# Patient Record
Sex: Female | Born: 1940
Health system: Southern US, Community
[De-identification: ages and names within clinical notes are randomized; demographics above are authoritative.]

## PROBLEM LIST (undated history)

## (undated) DIAGNOSIS — E785 Hyperlipidemia, unspecified: Secondary | ICD-10-CM

## (undated) DIAGNOSIS — G2581 Restless legs syndrome: Secondary | ICD-10-CM

## (undated) DIAGNOSIS — I10 Essential (primary) hypertension: Secondary | ICD-10-CM

## (undated) DIAGNOSIS — N390 Urinary tract infection, site not specified: Secondary | ICD-10-CM

## (undated) HISTORY — DX: Restless legs syndrome: G25.81

## (undated) HISTORY — DX: Essential (primary) hypertension: I10

## (undated) HISTORY — DX: Hyperlipidemia, unspecified: E78.5

## (undated) HISTORY — DX: Urinary tract infection, site not specified: N39.0

---
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
## 1995-07-20 HISTORY — PX: ABDOMINOPLASTY: SUR9

## 2014-08-14 DIAGNOSIS — N183 Chronic kidney disease, stage 3 unspecified: Secondary | ICD-10-CM | POA: Insufficient documentation

## 2014-08-14 DIAGNOSIS — I10 Essential (primary) hypertension: Secondary | ICD-10-CM

## 2014-08-14 DIAGNOSIS — E785 Hyperlipidemia, unspecified: Secondary | ICD-10-CM | POA: Insufficient documentation

## 2014-08-14 DIAGNOSIS — G2581 Restless legs syndrome: Secondary | ICD-10-CM

## 2014-08-14 DIAGNOSIS — I129 Hypertensive chronic kidney disease with stage 1 through stage 4 chronic kidney disease, or unspecified chronic kidney disease: Secondary | ICD-10-CM | POA: Insufficient documentation

## 2014-08-14 HISTORY — DX: Hyperlipidemia, unspecified: E78.5

## 2014-08-14 HISTORY — DX: Restless legs syndrome: G25.81

## 2014-08-14 HISTORY — DX: Essential (primary) hypertension: I10

## 2015-07-03 ENCOUNTER — Encounter: Payer: Self-pay | Admitting: Urology

## 2015-07-03 ENCOUNTER — Ambulatory Visit (INDEPENDENT_AMBULATORY_CARE_PROVIDER_SITE_OTHER): Payer: Medicare Other | Admitting: Urology

## 2015-07-03 VITALS — BP 157/78 | HR 73 | Ht 59.0 in | Wt 120.7 lb

## 2015-07-03 DIAGNOSIS — N393 Stress incontinence (female) (male): Secondary | ICD-10-CM | POA: Diagnosis not present

## 2015-07-03 DIAGNOSIS — Z87440 Personal history of urinary (tract) infections: Secondary | ICD-10-CM

## 2015-07-03 DIAGNOSIS — R351 Nocturia: Secondary | ICD-10-CM | POA: Diagnosis not present

## 2015-07-03 LAB — URINALYSIS, COMPLETE
BILIRUBIN UA: NEGATIVE
Glucose, UA: NEGATIVE
Ketones, UA: NEGATIVE
LEUKOCYTES UA: NEGATIVE
Nitrite, UA: NEGATIVE
PH UA: 7 (ref 5.0–7.5)
Protein, UA: NEGATIVE
Specific Gravity, UA: 1.01 (ref 1.005–1.030)
Urobilinogen, Ur: 0.2 mg/dL (ref 0.2–1.0)

## 2015-07-03 LAB — MICROSCOPIC EXAMINATION
BACTERIA UA: NONE SEEN
EPITHELIAL CELLS (NON RENAL): NONE SEEN /HPF (ref 0–10)
WBC UA: NONE SEEN /HPF (ref 0–?)

## 2015-07-03 NOTE — Progress Notes (Signed)
07/03/2015 4:00 PM   Kathryn Love 1940/12/24 [id number]  Referring provider/ PCP: Frazier Richards  Chief Complaint  Patient presents with  . Establish Care    New Patient    HPI: 74 yo F with who moved from [facility] who has moved here to live at [facility] with her husband.  She previously followed with a urologist there annually and would like to establish care here [facility].  She has a history of recurrent UTIs.  She is using Nitrofurantoin 50 mg self initiated when she has foul smelling urine.  She generally uses this medication for 4-5 days until her symptoms resolve. She is only needed to do this once over the past year. She's had no documented urine culture proven UTIs in the past year.  No personal history of kidney stones. No history of pyelonephritis or admisions for UITs.    She does drink plenty of water.     She does get up appromatley once nightly to void.   She doe have some mild SUI, wears 1 ppd which is not  No urgency, frequency, or urge incontience.     PMH: Past Medical History  Diagnosis Date  . Essential (primary) hypertension 08/14/2014    Last Assessment & Plan:  Blood pressure has been controlled without significant dizzyness or associated fatigue. Taking antihypertensives as directed without difficulty.    Marland Kitchen HLD (hyperlipidemia) 08/14/2014    Last Assessment & Plan:  Has been working on a low fat diet and no myalgia's are present.    Marland Kitchen Restless leg 08/14/2014    Last Assessment & Plan:  Gabapentin and Lorrin Mais are controlling symptoms reasonably well.    . Recurrent UTI     Surgical History: Past Surgical History  Procedure Laterality Date  . Tonsillectomy  1947  . Abdominoplasty  1997    Home Medications:    Medication List       This list is accurate as of: 07/03/15  4:00 PM.  Always use your most recent med list.               amLODipine 2.5 MG tablet  Commonly known as:  NORVASC     gabapentin 100 MG  capsule  Commonly known as:  NEURONTIN  Take by mouth.     lisinopril 40 MG tablet  Commonly known as:  PRINIVIL,ZESTRIL     multivitamin capsule  Take 1 capsule by mouth daily.     nitrofurantoin 50 MG capsule  Commonly known as:  MACRODANTIN  Take by mouth.     vitamin B-12 100 MCG tablet  Commonly known as:  CYANOCOBALAMIN  Take 100 mcg by mouth daily.     zolpidem 10 MG tablet  Commonly known as:  AMBIEN  Take by mouth.        Allergies: No Known Allergies  Family History: Family History  Problem Relation Age of Onset  . Bladder Cancer Mother   . Prostate cancer Neg Hx   . Kidney cancer Neg Hx     Social History:  reports that she has quit smoking. She does not have any smokeless tobacco history on file. She reports that she drinks alcohol. She reports that she does not use illicit drugs.  ROS: UROLOGY Frequent Urination?: No Hard to postpone urination?: No Burning/pain with urination?: No Get up at night to urinate?: Yes Leakage of urine?: No Urine stream starts and stops?: No Trouble starting stream?: No Do you have to strain to urinate?:  No Blood in urine?: No Urinary tract infection?: No Sexually transmitted disease?: No Injury to kidneys or bladder?: No Painful intercourse?: No Weak stream?: No Currently pregnant?: No Vaginal bleeding?: No Last menstrual period?: postmen  Gastrointestinal Nausea?: No Vomiting?: No Indigestion/heartburn?: No Diarrhea?: No Constipation?: No  Constitutional Fever: No Night sweats?: No Weight loss?: No Fatigue?: No  Skin Skin rash/lesions?: No Itching?: Yes  Eyes Blurred vision?: No Double vision?: No  Ears/Nose/Throat Sore throat?: No Sinus problems?: No  Hematologic/Lymphatic Swollen glands?: No Easy bruising?: Yes  Cardiovascular Leg swelling?: No Chest pain?: No  Respiratory Cough?: No Shortness of breath?: No  Endocrine Excessive thirst?: No  Musculoskeletal Back pain?:  No Joint pain?: No  Neurological Headaches?: No Dizziness?: No  Psychologic Depression?: No Anxiety?: No  Physical Exam: BP 157/78 mmHg  Pulse 73  Ht 4\' 11"  (1.499 m)  Wt 120 lb 11.2 oz (54.749 kg)  BMI 24.37 kg/m2  Constitutional:  Alert and oriented, No acute distress.   Healthy appearing for age.   HEENT:  Bend AT, moist mucus membranes.  Trachea midline, no masses. Cardiovascular: No clubbing, cyanosis, or edema. Respiratory: Normal respiratory effort, no increased work of breathing. GI: Abdomen is soft, nontender, nondistended, no abdominal masses GU: No CVA tenderness.  Skin: No rashes, bruises or suspicious lesions. Lymph: No cervical or inguinal adenopathy. Neurologic: Grossly intact, no focal deficits, moving all 4 extremities. Psychiatric: Normal mood and affect.  Laboratory Data: Basic Metabolic Panel (BMP) (02/04/2015 8:34 AM) Basic Metabolic Panel (BMP) (02/04/2015 8:34 AM)  Component Value Ref Range  Glucose 97 70 - 110 mg/dL  Sodium 02/06/2015 911 - 799 mmol/L  Potassium 5.0 3.6 - 5.1 mmol/L  Chloride 101 97 - 109 mmol/L  Carbon Dioxide (CO2) 31.3 22.0 - 32.0 mmol/L  Calcium 9.6 8.7 - 10.3 mg/dL  Urea Nitrogen (BUN) 13 7 - 25 mg/dL  Creatinine 0.8 0.6 - 1.1 mg/dL  Glomerular Filtration Rate (eGFR), MDRD Estimate 70      Urinalysis Results for orders placed or performed in visit on 07/03/15  Microscopic Examination  Result Value Ref Range   WBC, UA None seen 0 -  5 /hpf   RBC, UA 0-2 0 -  2 /hpf   Epithelial Cells (non renal) None seen 0 - 10 /hpf   Bacteria, UA None seen None seen/Few  Urinalysis, Complete  Result Value Ref Range   Specific Gravity, UA 1.010 1.005 - 1.030   pH, UA 7.0 5.0 - 7.5   Color, UA Yellow Yellow   Appearance Ur Clear Clear   Leukocytes, UA Negative Negative   Protein, UA Negative Negative/Trace   Glucose, UA Negative Negative   Ketones, UA Negative Negative   RBC, UA 1+ (A) Negative   Bilirubin, UA Negative Negative    Urobilinogen, Ur 0.2 0.2 - 1.0 mg/dL   Nitrite, UA Negative Negative   Microscopic Examination See below:      Pertinent Imaging: n/a  Assessment & Plan:    1. History of recurrent UTIs Remote history of recurrent urinary tract infections. She seems to be doing very well with her current regimen of low-dose Macrobid around the time of symptoms. She rarely has to use this. Plan to continue this regimen as it seems to be working.  She was advised she is more than welcome to drop off a urine sample here should she experience any worrisome symptoms. We're happy to see her for same day visit as needed.  2. Nocturia Minimal, once nightly. No bother. -  Urinalysis, Complete  3. SUI Mild, no intervention needed   Return if symptoms worsen or fail to improve.  Hollice Espy, MD  [facility] [address], [place], [place] [address] [phone]

## 2015-08-12 DIAGNOSIS — E7800 Pure hypercholesterolemia, unspecified: Secondary | ICD-10-CM | POA: Diagnosis not present

## 2015-08-12 DIAGNOSIS — I10 Essential (primary) hypertension: Secondary | ICD-10-CM | POA: Diagnosis not present

## 2015-08-19 DIAGNOSIS — Z0000 Encounter for general adult medical examination without abnormal findings: Secondary | ICD-10-CM | POA: Diagnosis not present

## 2015-08-19 DIAGNOSIS — I10 Essential (primary) hypertension: Secondary | ICD-10-CM | POA: Diagnosis not present

## 2015-08-19 DIAGNOSIS — Z1231 Encounter for screening mammogram for malignant neoplasm of breast: Secondary | ICD-10-CM | POA: Diagnosis not present

## 2015-08-19 DIAGNOSIS — E7800 Pure hypercholesterolemia, unspecified: Secondary | ICD-10-CM | POA: Diagnosis not present

## 2015-08-19 DIAGNOSIS — M810 Age-related osteoporosis without current pathological fracture: Secondary | ICD-10-CM | POA: Insufficient documentation

## 2015-08-19 DIAGNOSIS — M8580 Other specified disorders of bone density and structure, unspecified site: Secondary | ICD-10-CM | POA: Diagnosis not present

## 2015-08-19 DIAGNOSIS — G2581 Restless legs syndrome: Secondary | ICD-10-CM | POA: Diagnosis not present

## 2015-08-26 ENCOUNTER — Inpatient Hospital Stay
Admission: RE | Admit: 2015-08-26 | Discharge: 2015-08-26 | Disposition: A | Discharge status: Home / Self Care | Payer: Self-pay | Source: Ambulatory Visit | Attending: *Deleted | Admitting: *Deleted

## 2015-08-26 ENCOUNTER — Other Ambulatory Visit: Payer: Self-pay | Admitting: Internal Medicine

## 2015-08-26 ENCOUNTER — Other Ambulatory Visit: Payer: Self-pay | Admitting: *Deleted

## 2015-08-26 DIAGNOSIS — Z1231 Encounter for screening mammogram for malignant neoplasm of breast: Secondary | ICD-10-CM

## 2015-08-26 DIAGNOSIS — Z9289 Personal history of other medical treatment: Secondary | ICD-10-CM

## 2015-08-29 ENCOUNTER — Ambulatory Visit
Admission: RE | Admit: 2015-08-29 | Discharge: 2015-08-29 | Disposition: A | Discharge status: Home / Self Care | Payer: PPO | Source: Ambulatory Visit | Attending: Internal Medicine | Admitting: Internal Medicine

## 2015-08-29 DIAGNOSIS — M8580 Other specified disorders of bone density and structure, unspecified site: Secondary | ICD-10-CM | POA: Diagnosis not present

## 2015-08-29 DIAGNOSIS — Z1231 Encounter for screening mammogram for malignant neoplasm of breast: Secondary | ICD-10-CM | POA: Insufficient documentation

## 2015-08-29 DIAGNOSIS — Z86010 Personal history of colonic polyps: Secondary | ICD-10-CM | POA: Diagnosis not present

## 2015-09-12 DIAGNOSIS — L821 Other seborrheic keratosis: Secondary | ICD-10-CM | POA: Diagnosis not present

## 2015-09-15 ENCOUNTER — Ambulatory Visit: Payer: PPO | Admitting: Certified Registered Nurse Anesthetist

## 2015-09-15 ENCOUNTER — Encounter: Payer: Self-pay | Admitting: *Deleted

## 2015-09-15 ENCOUNTER — Encounter
Admission: RE | Disposition: A | Discharge status: Home / Self Care | Payer: Self-pay | Source: Ambulatory Visit | Attending: Gastroenterology

## 2015-09-15 ENCOUNTER — Ambulatory Visit
Admission: RE | Admit: 2015-09-15 | Discharge: 2015-09-15 | Disposition: A | Discharge status: Home / Self Care | Payer: PPO | Source: Ambulatory Visit | Attending: Gastroenterology | Admitting: Gastroenterology

## 2015-09-15 DIAGNOSIS — K5730 Diverticulosis of large intestine without perforation or abscess without bleeding: Secondary | ICD-10-CM | POA: Diagnosis not present

## 2015-09-15 DIAGNOSIS — K640 First degree hemorrhoids: Secondary | ICD-10-CM | POA: Insufficient documentation

## 2015-09-15 DIAGNOSIS — K635 Polyp of colon: Secondary | ICD-10-CM | POA: Diagnosis not present

## 2015-09-15 DIAGNOSIS — G2581 Restless legs syndrome: Secondary | ICD-10-CM | POA: Diagnosis not present

## 2015-09-15 DIAGNOSIS — E785 Hyperlipidemia, unspecified: Secondary | ICD-10-CM | POA: Insufficient documentation

## 2015-09-15 DIAGNOSIS — Z79899 Other long term (current) drug therapy: Secondary | ICD-10-CM | POA: Diagnosis not present

## 2015-09-15 DIAGNOSIS — Z87891 Personal history of nicotine dependence: Secondary | ICD-10-CM | POA: Diagnosis not present

## 2015-09-15 DIAGNOSIS — K5790 Diverticulosis of intestine, part unspecified, without perforation or abscess without bleeding: Secondary | ICD-10-CM | POA: Diagnosis not present

## 2015-09-15 DIAGNOSIS — I10 Essential (primary) hypertension: Secondary | ICD-10-CM | POA: Diagnosis not present

## 2015-09-15 DIAGNOSIS — K648 Other hemorrhoids: Secondary | ICD-10-CM | POA: Diagnosis not present

## 2015-09-15 DIAGNOSIS — Z1211 Encounter for screening for malignant neoplasm of colon: Secondary | ICD-10-CM | POA: Insufficient documentation

## 2015-09-15 DIAGNOSIS — D120 Benign neoplasm of cecum: Secondary | ICD-10-CM | POA: Insufficient documentation

## 2015-09-15 DIAGNOSIS — Z86010 Personal history of colonic polyps: Secondary | ICD-10-CM | POA: Diagnosis not present

## 2015-09-15 HISTORY — PX: COLONOSCOPY WITH PROPOFOL: SHX5780

## 2015-09-15 SURGERY — COLONOSCOPY WITH PROPOFOL
Anesthesia: General

## 2015-09-15 MED ORDER — SODIUM CHLORIDE 0.9 % IV SOLN
INTRAVENOUS | Status: DC
Start: 2015-09-15 — End: 2015-09-15
  Administered 2015-09-15: 10:00:00 via INTRAVENOUS

## 2015-09-15 MED ORDER — PROPOFOL 500 MG/50ML IV EMUL
INTRAVENOUS | Status: DC | PRN
  Administered 2015-09-15: 100 ug/kg/min via INTRAVENOUS

## 2015-09-15 MED ORDER — PROPOFOL 10 MG/ML IV BOLUS
INTRAVENOUS | Status: DC | PRN
  Administered 2015-09-15: 20 mg via INTRAVENOUS
  Administered 2015-09-15: 30 mg via INTRAVENOUS

## 2015-09-15 MED ORDER — MIDAZOLAM HCL 2 MG/2ML IJ SOLN
INTRAMUSCULAR | Status: DC | PRN
  Administered 2015-09-15: 1 mg via INTRAVENOUS

## 2015-09-15 NOTE — Discharge Instructions (Signed)

## 2015-09-15 NOTE — Transfer of Care (Signed)
Immediate Anesthesia Transfer of Care Note  Patient: Kathryn Love  Procedure(s) Performed: Procedure(s): COLONOSCOPY WITH PROPOFOL (N/A)  Patient Location: PACU  Anesthesia Type:General  Level of Consciousness: sedated  Airway & Oxygen Therapy: Patient Spontanous Breathing and Patient connected to nasal cannula oxygen  Post-op Assessment: Report given to RN and Post -op Vital signs reviewed and stable  Post vital signs: Reviewed and stable  Last Vitals:  Filed Vitals:   09/15/15 0935 09/15/15 1034  BP: 153/74 86/52  Pulse: 67 60  Temp: 37.1 C 35.9 C  Resp: 15 19    Complications: No apparent anesthesia complications

## 2015-09-15 NOTE — Anesthesia Procedure Notes (Signed)
Date/Time: 09/15/2015 10:05 AM Performed by: Johnna Acosta Pre-anesthesia Checklist: Patient identified, Emergency Drugs available, Suction available, Patient being monitored and Timeout performed Patient Re-evaluated:Patient Re-evaluated prior to inductionOxygen Delivery Method: Nasal cannula

## 2015-09-15 NOTE — Op Note (Signed)
[facility] Patient Name: Kathryn Love Procedure Date: 09/15/2015 9:39 AM MRN: [id number] Account #: [id number] Date of Birth: Mar 05, 1941 Admit Type: Outpatient Age: 75 Room: [facility] ENDO ROOM 3 Gender: Female Note Status: Finalized Procedure:            Colonoscopy Indications:          Surveillance: Personal history of adenomatous polyps on                        last colonoscopy 3 years ago Patient Profile:      This is a 75 year old female. Providers:            Gerrit Heck. Rayann Heman, MD Referring MD:         Ocie Cornfield. Ouida Sills, MD (Referring MD) Medicines:            Propofol per Anesthesia Complications:        No immediate complications. Procedure:            Pre-Anesthesia Assessment:                       - Prior to the procedure, a History and Physical was                        performed, and patient medications, allergies and                        sensitivities were reviewed. The patient's tolerance of                        previous anesthesia was reviewed.                       After obtaining informed consent, the colonoscope was                        passed under direct vision. Throughout the procedure,                        the patient's blood pressure, pulse, and oxygen                        saturations were monitored continuously. The                        Colonoscope was introduced through the anus and                        advanced to the the cecum, identified by appendiceal                        orifice and ileocecal valve. The colonoscopy was                        performed without difficulty. The patient tolerated the                        procedure well. The quality of the bowel preparation                        was excellent. Findings:  The perianal and digital rectal examinations were normal.      A 5 mm polyp was found in the cecum. The polyp was sessile. The polyp       was removed with a cold snare. Resection  and retrieval were complete.      A 3 mm polyp was found in the ileocecal valve. The polyp was sessile.       The polyp was removed with a jumbo cold forceps. Resection and retrieval       were complete.      A tattoo was seen in the sigmoid colon. The tattoo site appeared normal.      A tattoo was seen in the proximal ascending colon. The tattoo site       appeared normal.      A few small and large-mouthed diverticula were found in the sigmoid       colon.      Internal hemorrhoids were found during retroflexion. The hemorrhoids       were Grade I (internal hemorrhoids that do not prolapse).      The exam was otherwise without abnormality. Impression:           - One 5 mm polyp in the cecum, removed with a cold                        snare. Resected and retrieved.                       - One 3 mm polyp at the ileocecal valve, removed with a                        jumbo cold forceps. Resected and retrieved.                       - A tattoo was seen in the sigmoid colon. The tattoo                        site appeared normal.                       - A tattoo was seen in the proximal ascending colon.                        The tattoo site appeared normal.                       - Diverticulosis in the sigmoid colon.                       - Internal hemorrhoids.                       - The examination was otherwise normal. Recommendation:       - Observe patient in GI recovery unit.                       - High fiber diet.                       - Continue present medications.                       - Await pathology  results.                       - Return to referring physician.                       - The findings and recommendations were discussed with                        the patient.                       - The findings and recommendations were discussed with                        the patient's family.                       - Consider repeat colonoscopy for surveillance based on                         pathology results, will need to weigh risks and                        benefits given advanced age.. Procedure Code(s):    --- Professional ---                       [phone], Colonoscopy, flexible; with removal of tumor(s),                        polyp(s), or other lesion(s) by snare technique                       45380, 59, Colonoscopy, flexible; with biopsy, single                        or multiple Diagnosis Code(s):    --- Professional ---                       Z86.010, Personal history of colonic polyps                       D12.0, Benign neoplasm of cecum                       K64.0, First degree hemorrhoids                       K57.30, Diverticulosis of large intestine without                        perforation or abscess without bleeding CPT copyright 2016 American Medical Association. All rights reserved. The codes documented in this report are preliminary and upon coder review may  be revised to meet current compliance requirements. Mellody Life, MD 09/15/2015 10:34:38 AM This report has been signed electronically. Number of Addenda: 0 Note Initiated On: 09/15/2015 9:39 AM Scope Withdrawal Time: 0 hours 13 minutes 36 seconds  Total Procedure Duration: 0 hours 22 minutes 43 seconds       [facility]

## 2015-09-15 NOTE — Anesthesia Postprocedure Evaluation (Signed)
Anesthesia Post Note  Patient: Kathryn Love  Procedure(s) Performed: Procedure(s) (LRB): COLONOSCOPY WITH PROPOFOL (N/A)  Patient location during evaluation: PACU Anesthesia Type: General Level of consciousness: awake and alert Pain management: pain level controlled Vital Signs Assessment: post-procedure vital signs reviewed and stable Respiratory status: spontaneous breathing, nonlabored ventilation, respiratory function stable and patient connected to nasal cannula oxygen Cardiovascular status: blood pressure returned to baseline and stable Postop Assessment: no signs of nausea or vomiting Anesthetic complications: no    Last Vitals:  Filed Vitals:   09/15/15 1050 09/15/15 1100  BP: 97/57 116/68  Pulse: 57 68  Temp:    Resp: 14 19    Last Pain: There were no vitals filed for this visit.               Molli Barrows

## 2015-09-15 NOTE — H&P (Signed)
  Primary Care Physician:  Kirk Ruths., MD  Pre-Procedure History & Physical: HPI:  Kathryn Love is a 75 y.o. female is here for an colonoscopy.   Past Medical History  Diagnosis Date  . Essential (primary) hypertension 08/14/2014    Last Assessment & Plan:  Blood pressure has been controlled without significant dizzyness or associated fatigue. Taking antihypertensives as directed without difficulty.    Marland Kitchen HLD (hyperlipidemia) 08/14/2014    Last Assessment & Plan:  Has been working on a low fat diet and no myalgia's are present.    Marland Kitchen Restless leg 08/14/2014    Last Assessment & Plan:  Gabapentin and Lorrin Mais are controlling symptoms reasonably well.    . Recurrent UTI     Past Surgical History  Procedure Laterality Date  . Tonsillectomy  1947  . Abdominoplasty  1997  . Breast cyst excision Right 1960    Prior to Admission medications   Medication Sig Start Date End Date Taking? Authorizing Provider  amLODipine (NORVASC) 2.5 MG tablet  06/20/15  Yes Historical Provider, MD  lisinopril (PRINIVIL,ZESTRIL) 40 MG tablet  06/20/15  Yes Historical Provider, MD  Multiple Vitamin (MULTIVITAMIN) capsule Take 1 capsule by mouth daily.   Yes Historical Provider, MD  vitamin B-12 (CYANOCOBALAMIN) 100 MCG tablet Take 100 mcg by mouth daily.   Yes Historical Provider, MD  gabapentin (NEURONTIN) 100 MG capsule Take by mouth.    Historical Provider, MD  nitrofurantoin (MACRODANTIN) 50 MG capsule Take by mouth.    Historical Provider, MD  zolpidem (AMBIEN) 10 MG tablet Take by mouth. 02/11/15   Historical Provider, MD    Allergies as of 09/10/2015  . (No Known Allergies)    Family History  Problem Relation Age of Onset  . Bladder Cancer Mother   . Prostate cancer Neg Hx   . Kidney cancer Neg Hx   . Breast cancer Maternal Grandmother 49    Unsure    Social History   Social History  . Marital Status: Married    Spouse Name: N/A  . Number of Children: N/A  . Years of Education: N/A    Occupational History  . Not on file.   Social History Main Topics  . Smoking status: Former Research scientist (life sciences)  . Smokeless tobacco: Not on file  . Alcohol Use: 0.0 oz/week    0 Standard drinks or equivalent per week  . Drug Use: No  . Sexual Activity: Not on file   Other Topics Concern  . Not on file   Social History Narrative     Physical Exam: BP 153/74 mmHg  Pulse 67  Temp(Src) 98.7 F (37.1 C) (Tympanic)  Resp 15  Ht 4\' 10"  (1.473 m)  Wt 53.071 kg (117 lb)  BMI 24.46 kg/m2  SpO2 98% General:   Alert,  pleasant and cooperative in NAD Head:  Normocephalic and atraumatic. Neck:  Supple; no masses or thyromegaly. Lungs:  Clear throughout to auscultation.    Heart:  Regular rate and rhythm. Abdomen:  Soft, nontender and nondistended. Normal bowel sounds, without guarding, and without rebound.   Neurologic:  Alert and  oriented x4;  grossly normal neurologically.  Impression/Plan: Kathryn Love is here for an colonoscopy to be performed for surveillance  Risks, benefits, limitations, and alternatives regarding  colonoscopy have been reviewed with the patient.  Questions have been answered.  All parties agreeable.   Josefine Class, MD  09/15/2015, 10:03 AM

## 2015-09-15 NOTE — Anesthesia Preprocedure Evaluation (Signed)
Anesthesia Evaluation  Patient identified by MRN, date of birth, ID band Patient awake    Reviewed: Allergy & Precautions, H&P , NPO status , Patient's Chart, lab work & pertinent test results, reviewed documented beta blocker date and time   Airway Mallampati: II   Neck ROM: full    Dental  (+) Poor Dentition   Pulmonary neg pulmonary ROS, former smoker,    Pulmonary exam normal        Cardiovascular hypertension, negative cardio ROS Normal cardiovascular exam     Neuro/Psych negative neurological ROS  negative psych ROS   GI/Hepatic negative GI ROS, Neg liver ROS,   Endo/Other  negative endocrine ROS  Renal/GU negative Renal ROS  negative genitourinary   Musculoskeletal   Abdominal   Peds  Hematology negative hematology ROS (+)   Anesthesia Other Findings Past Medical History:   Essential (primary) hypertension                08/14/2014      Comment:Last Assessment & Plan:  Blood pressure has               been controlled without significant dizzyness               or associated fatigue. Taking antihypertensives              as directed without difficulty.     HLD (hyperlipidemia)                            08/14/2014      Comment:Last Assessment & Plan:  Has been working on a               low fat diet and no myalgia's are present.     Restless leg                                    08/14/2014      Comment:Last Assessment & Plan:  Gabapentin and Lorrin Mais               are controlling symptoms reasonably well.     Recurrent UTI                                              Past Surgical History:   TONSILLECTOMY                                    1947         ABDOMINOPLASTY                                   1997         BREAST CYST EXCISION                            Right 1960       BMI    Body Mass Index   24.45 kg/m 2     Reproductive/Obstetrics  Anesthesia  Physical Anesthesia Plan  ASA: II  Anesthesia Plan: General   Post-op Pain Management:    Induction:   Airway Management Planned:   Additional Equipment:   Intra-op Plan:   Post-operative Plan:   Informed Consent: I have reviewed the patients History and Physical, chart, labs and discussed the procedure including the risks, benefits and alternatives for the proposed anesthesia with the patient or authorized representative who has indicated his/her understanding and acceptance.   Dental Advisory Given  Plan Discussed with: CRNA  Anesthesia Plan Comments:         Anesthesia Quick Evaluation

## 2015-09-16 LAB — SURGICAL PATHOLOGY

## 2015-09-17 ENCOUNTER — Encounter: Payer: Self-pay | Admitting: Gastroenterology

## 2015-11-18 DIAGNOSIS — D0462 Carcinoma in situ of skin of left upper limb, including shoulder: Secondary | ICD-10-CM | POA: Diagnosis not present

## 2015-11-18 DIAGNOSIS — D485 Neoplasm of uncertain behavior of skin: Secondary | ICD-10-CM | POA: Diagnosis not present

## 2015-12-12 DIAGNOSIS — D0462 Carcinoma in situ of skin of left upper limb, including shoulder: Secondary | ICD-10-CM | POA: Diagnosis not present

## 2016-02-20 DIAGNOSIS — H26499 Other secondary cataract, unspecified eye: Secondary | ICD-10-CM | POA: Diagnosis not present

## 2016-03-12 DIAGNOSIS — L821 Other seborrheic keratosis: Secondary | ICD-10-CM | POA: Diagnosis not present

## 2016-03-12 DIAGNOSIS — X32XXXA Exposure to sunlight, initial encounter: Secondary | ICD-10-CM | POA: Diagnosis not present

## 2016-03-12 DIAGNOSIS — L570 Actinic keratosis: Secondary | ICD-10-CM | POA: Diagnosis not present

## 2016-03-12 DIAGNOSIS — L538 Other specified erythematous conditions: Secondary | ICD-10-CM | POA: Diagnosis not present

## 2016-03-12 DIAGNOSIS — D1801 Hemangioma of skin and subcutaneous tissue: Secondary | ICD-10-CM | POA: Diagnosis not present

## 2016-03-12 DIAGNOSIS — L298 Other pruritus: Secondary | ICD-10-CM | POA: Diagnosis not present

## 2016-03-12 DIAGNOSIS — L820 Inflamed seborrheic keratosis: Secondary | ICD-10-CM | POA: Diagnosis not present

## 2016-04-02 ENCOUNTER — Encounter: Payer: Self-pay | Admitting: Urology

## 2016-04-02 ENCOUNTER — Ambulatory Visit (INDEPENDENT_AMBULATORY_CARE_PROVIDER_SITE_OTHER): Payer: PPO | Admitting: Urology

## 2016-04-02 VITALS — BP 138/74 | HR 79 | Ht 59.0 in | Wt 119.0 lb

## 2016-04-02 DIAGNOSIS — N390 Urinary tract infection, site not specified: Secondary | ICD-10-CM | POA: Diagnosis not present

## 2016-04-02 DIAGNOSIS — R8290 Unspecified abnormal findings in urine: Secondary | ICD-10-CM | POA: Diagnosis not present

## 2016-04-02 LAB — URINALYSIS, COMPLETE
BILIRUBIN UA: NEGATIVE
GLUCOSE, UA: NEGATIVE
Ketones, UA: NEGATIVE
LEUKOCYTES UA: NEGATIVE
Nitrite, UA: NEGATIVE
PH UA: 7 (ref 5.0–7.5)
PROTEIN UA: NEGATIVE
Specific Gravity, UA: 1.01 (ref 1.005–1.030)
UUROB: 0.2 mg/dL (ref 0.2–1.0)

## 2016-04-02 LAB — BLADDER SCAN AMB NON-IMAGING

## 2016-04-02 LAB — MICROSCOPIC EXAMINATION: Bacteria, UA: NONE SEEN

## 2016-04-02 NOTE — Progress Notes (Signed)
04/02/2016 1:12 PM   Kathryn Love 07-10-1941 [id number]  Referring provider: Kirk Ruths, MD [facility] [place], [place] [address]  Chief Complaint  Patient presents with  . Recurrent UTI    HPI: 75 yo F with "recurrent" UTIs who returns for routine follow up after experience urinary symptoms 3 weeks ago which has resolved.    She has been managed with macrobid 50 mg once daily x 5 days when she feels that she is getting an infection as recommended by a previous Urologist.    She developed symptoms of foul smelling urine 3 weeks ago and was concerned about a UTI.  No other urinary symptoms including no dysuria, hematuria, or frequency/ urgency.  She self initiated abx and her symptoms resolved.  No UA/ UCx performed.    She has no urinary complaints today.    She does drink plenty of water.   She reports that she has a history of microscopic hematuria and had work up by her previous urology about ~1.5 year ago which was presumably normal.  She will send her records.     PMH: Past Medical History:  Diagnosis Date  . Essential (primary) hypertension 08/14/2014   Last Assessment & Plan:  Blood pressure has been controlled without significant dizzyness or associated fatigue. Taking antihypertensives as directed without difficulty.    Marland Kitchen HLD (hyperlipidemia) 08/14/2014   Last Assessment & Plan:  Has been working on a low fat diet and no myalgia's are present.    . Recurrent UTI   . Restless leg 08/14/2014   Last Assessment & Plan:  Gabapentin and Lorrin Mais are controlling symptoms reasonably well.      Surgical History: Past Surgical History:  Procedure Laterality Date  . ABDOMINOPLASTY  1997  . BREAST CYST EXCISION Right 1960  . COLONOSCOPY WITH PROPOFOL N/A 09/15/2015   Procedure: COLONOSCOPY WITH PROPOFOL;  Surgeon: Josefine Class, MD;  Location: [facility] ENDOSCOPY;  Service: Endoscopy;  Laterality: N/A;  . TONSILLECTOMY  1947    Home  Medications:    Medication List       Accurate as of 04/02/16  1:12 PM. Always use your most recent med list.          amLODipine 2.5 MG tablet Commonly known as:  NORVASC   calcium carbonate 1250 (500 Ca) MG tablet Commonly known as:  OS-CAL - dosed in mg of elemental calcium Take by mouth.   gabapentin 100 MG capsule Commonly known as:  NEURONTIN Take by mouth.   lisinopril 40 MG tablet Commonly known as:  PRINIVIL,ZESTRIL   Magnesium 250 MG Tabs Take by mouth.   multivitamin capsule Take 1 capsule by mouth daily.   nitrofurantoin 50 MG capsule Commonly known as:  MACRODANTIN Take by mouth.   polyethylene glycol-electrolytes 420 g solution Commonly known as:  NuLYTELY/GoLYTELY Take by mouth.   vitamin B-12 100 MCG tablet Commonly known as:  CYANOCOBALAMIN Take 100 mcg by mouth daily.   zolpidem 10 MG tablet Commonly known as:  AMBIEN Take by mouth.       Allergies: No Known Allergies  Family History: Family History  Problem Relation Age of Onset  . Bladder Cancer Mother   . Breast cancer Maternal Grandmother 67    Unsure  . Prostate cancer Neg Hx   . Kidney cancer Neg Hx     Social History:  reports that she has quit smoking. She has never used smokeless tobacco. She reports that  she drinks alcohol. She reports that she does not use drugs.  ROS: UROLOGY Frequent Urination?: No Hard to postpone urination?: No Burning/pain with urination?: No Get up at night to urinate?: No Leakage of urine?: No Urine stream starts and stops?: No Trouble starting stream?: No Do you have to strain to urinate?: No Blood in urine?: No Urinary tract infection?: No Sexually transmitted disease?: No Injury to kidneys or bladder?: No Painful intercourse?: No Weak stream?: No Currently pregnant?: No Vaginal bleeding?: No Last menstrual period?: n  Gastrointestinal Nausea?: No Vomiting?: No Indigestion/heartburn?: No Diarrhea?: No Constipation?:  No  Constitutional Fever: No Night sweats?: No Weight loss?: No Fatigue?: No  Skin Skin rash/lesions?: No Itching?: No  Eyes Blurred vision?: No Double vision?: No  Ears/Nose/Throat Sore throat?: No Sinus problems?: No  Hematologic/Lymphatic Swollen glands?: No Easy bruising?: No  Cardiovascular Leg swelling?: No Chest pain?: No  Respiratory Cough?: No Shortness of breath?: No  Endocrine Excessive thirst?: No  Musculoskeletal Back pain?: No Joint pain?: No  Neurological Headaches?: No Dizziness?: No  Psychologic Depression?: No Anxiety?: No  Physical Exam: BP 138/74   Pulse 79   Ht 4\' 11"  (1.499 m)   Wt 119 lb (54 kg)   BMI 24.04 kg/m   Constitutional:  Alert and oriented, No acute distress. HEENT: [place] AT, moist mucus membranes.  Trachea midline, no masses. Cardiovascular: No clubbing, cyanosis, or edema. Respiratory: Normal respiratory effort, no increased work of breathing. Skin: No rashes, bruises or suspicious lesions. Neurologic: Grossly intact, no focal deficits, moving all 4 extremities. Psychiatric: Normal mood and affect.  Laboratory Data: Labs and care every were reviewed today, creatinine 0.9 on 08/12/2015  Urinalysis UA today with 3-10 RBC/ HFP, otherwise negative, see EPIC  Pertinent Imaging: n/a  Assessment & Plan:    1. Recurrent UTI -Continue to encourage patient to bring in a UA/urine culture when she feels that she is having an infection as it is unclear whether or not she truly has recurrent urinary tract infections -Seems to be doing well with current regimen of Macrobid prophylactically but will likely not continue this prescription future due to concern for antibacterial resistance -Discussed probiotics today  - Urinalysis, Complete   2. Bad odor of urine Reviewed again today that foul-smelling odor is not necessarily indicative of an infection Encouraged adequate hydration and hygiene  3. Miroscopic  hematuria Incidental microscopic hematuria today, patient reports a long history of this in its previously undergone workup with her former urologist We will continue to follow this, review records at next appointment  Return in about 6 months (around 09/30/2016).  Hollice Espy, MD  [facility] [address] [place], [place] [address] [phone]

## 2016-06-04 DIAGNOSIS — Z0000 Encounter for general adult medical examination without abnormal findings: Secondary | ICD-10-CM | POA: Diagnosis not present

## 2016-08-13 DIAGNOSIS — I10 Essential (primary) hypertension: Secondary | ICD-10-CM | POA: Diagnosis not present

## 2016-08-13 DIAGNOSIS — Z0000 Encounter for general adult medical examination without abnormal findings: Secondary | ICD-10-CM | POA: Diagnosis not present

## 2016-08-13 DIAGNOSIS — E7800 Pure hypercholesterolemia, unspecified: Secondary | ICD-10-CM | POA: Diagnosis not present

## 2016-08-19 DIAGNOSIS — R05 Cough: Secondary | ICD-10-CM | POA: Diagnosis not present

## 2016-08-19 DIAGNOSIS — M810 Age-related osteoporosis without current pathological fracture: Secondary | ICD-10-CM | POA: Diagnosis not present

## 2016-08-19 DIAGNOSIS — I10 Essential (primary) hypertension: Secondary | ICD-10-CM | POA: Diagnosis not present

## 2016-08-19 DIAGNOSIS — E7800 Pure hypercholesterolemia, unspecified: Secondary | ICD-10-CM | POA: Diagnosis not present

## 2016-08-19 DIAGNOSIS — Z9289 Personal history of other medical treatment: Secondary | ICD-10-CM | POA: Insufficient documentation

## 2016-08-19 DIAGNOSIS — Z0000 Encounter for general adult medical examination without abnormal findings: Secondary | ICD-10-CM | POA: Diagnosis not present

## 2016-08-19 DIAGNOSIS — R7611 Nonspecific reaction to tuberculin skin test without active tuberculosis: Secondary | ICD-10-CM | POA: Diagnosis not present

## 2016-08-27 DIAGNOSIS — H26499 Other secondary cataract, unspecified eye: Secondary | ICD-10-CM | POA: Diagnosis not present

## 2016-09-17 DIAGNOSIS — L538 Other specified erythematous conditions: Secondary | ICD-10-CM | POA: Diagnosis not present

## 2016-09-17 DIAGNOSIS — Z85828 Personal history of other malignant neoplasm of skin: Secondary | ICD-10-CM | POA: Diagnosis not present

## 2016-09-17 DIAGNOSIS — D1801 Hemangioma of skin and subcutaneous tissue: Secondary | ICD-10-CM | POA: Diagnosis not present

## 2016-09-17 DIAGNOSIS — B078 Other viral warts: Secondary | ICD-10-CM | POA: Diagnosis not present

## 2016-09-17 DIAGNOSIS — Z08 Encounter for follow-up examination after completed treatment for malignant neoplasm: Secondary | ICD-10-CM | POA: Diagnosis not present

## 2016-09-17 DIAGNOSIS — L821 Other seborrheic keratosis: Secondary | ICD-10-CM | POA: Diagnosis not present

## 2016-10-01 ENCOUNTER — Ambulatory Visit: Payer: PPO | Admitting: Urology

## 2016-10-14 DIAGNOSIS — J449 Chronic obstructive pulmonary disease, unspecified: Secondary | ICD-10-CM | POA: Diagnosis not present

## 2016-10-14 DIAGNOSIS — J310 Chronic rhinitis: Secondary | ICD-10-CM | POA: Diagnosis not present

## 2016-10-14 DIAGNOSIS — R0602 Shortness of breath: Secondary | ICD-10-CM | POA: Diagnosis not present

## 2017-02-11 DIAGNOSIS — I10 Essential (primary) hypertension: Secondary | ICD-10-CM | POA: Diagnosis not present

## 2017-02-11 DIAGNOSIS — E7800 Pure hypercholesterolemia, unspecified: Secondary | ICD-10-CM | POA: Diagnosis not present

## 2017-03-04 DIAGNOSIS — E7800 Pure hypercholesterolemia, unspecified: Secondary | ICD-10-CM | POA: Diagnosis not present

## 2017-03-04 DIAGNOSIS — G2581 Restless legs syndrome: Secondary | ICD-10-CM | POA: Diagnosis not present

## 2017-03-04 DIAGNOSIS — I10 Essential (primary) hypertension: Secondary | ICD-10-CM | POA: Diagnosis not present

## 2017-03-04 DIAGNOSIS — Z1231 Encounter for screening mammogram for malignant neoplasm of breast: Secondary | ICD-10-CM | POA: Diagnosis not present

## 2017-03-04 DIAGNOSIS — R7611 Nonspecific reaction to tuberculin skin test without active tuberculosis: Secondary | ICD-10-CM | POA: Diagnosis not present

## 2017-03-14 DIAGNOSIS — H40003 Preglaucoma, unspecified, bilateral: Secondary | ICD-10-CM | POA: Diagnosis not present

## 2017-03-25 DIAGNOSIS — Z08 Encounter for follow-up examination after completed treatment for malignant neoplasm: Secondary | ICD-10-CM | POA: Diagnosis not present

## 2017-03-25 DIAGNOSIS — Z85828 Personal history of other malignant neoplasm of skin: Secondary | ICD-10-CM | POA: Diagnosis not present

## 2017-03-25 DIAGNOSIS — L821 Other seborrheic keratosis: Secondary | ICD-10-CM | POA: Diagnosis not present

## 2017-09-02 DIAGNOSIS — I10 Essential (primary) hypertension: Secondary | ICD-10-CM | POA: Diagnosis not present

## 2017-09-02 DIAGNOSIS — R350 Frequency of micturition: Secondary | ICD-10-CM | POA: Diagnosis not present

## 2017-09-09 DIAGNOSIS — E7800 Pure hypercholesterolemia, unspecified: Secondary | ICD-10-CM | POA: Diagnosis not present

## 2017-09-09 DIAGNOSIS — Z1231 Encounter for screening mammogram for malignant neoplasm of breast: Secondary | ICD-10-CM | POA: Diagnosis not present

## 2017-09-09 DIAGNOSIS — I10 Essential (primary) hypertension: Secondary | ICD-10-CM | POA: Diagnosis not present

## 2017-09-09 DIAGNOSIS — G2581 Restless legs syndrome: Secondary | ICD-10-CM | POA: Diagnosis not present

## 2017-09-23 DIAGNOSIS — D2261 Melanocytic nevi of right upper limb, including shoulder: Secondary | ICD-10-CM | POA: Diagnosis not present

## 2017-09-23 DIAGNOSIS — D2272 Melanocytic nevi of left lower limb, including hip: Secondary | ICD-10-CM | POA: Diagnosis not present

## 2017-09-23 DIAGNOSIS — Z85828 Personal history of other malignant neoplasm of skin: Secondary | ICD-10-CM | POA: Diagnosis not present

## 2017-09-23 DIAGNOSIS — L570 Actinic keratosis: Secondary | ICD-10-CM | POA: Diagnosis not present

## 2017-09-23 DIAGNOSIS — X32XXXA Exposure to sunlight, initial encounter: Secondary | ICD-10-CM | POA: Diagnosis not present

## 2017-09-23 DIAGNOSIS — L821 Other seborrheic keratosis: Secondary | ICD-10-CM | POA: Diagnosis not present

## 2018-03-03 DIAGNOSIS — I10 Essential (primary) hypertension: Secondary | ICD-10-CM | POA: Diagnosis not present

## 2018-03-03 DIAGNOSIS — E7800 Pure hypercholesterolemia, unspecified: Secondary | ICD-10-CM | POA: Diagnosis not present

## 2018-03-10 DIAGNOSIS — Z0000 Encounter for general adult medical examination without abnormal findings: Secondary | ICD-10-CM | POA: Diagnosis not present

## 2018-03-10 DIAGNOSIS — R079 Chest pain, unspecified: Secondary | ICD-10-CM | POA: Insufficient documentation

## 2018-03-10 DIAGNOSIS — E7800 Pure hypercholesterolemia, unspecified: Secondary | ICD-10-CM | POA: Diagnosis not present

## 2018-03-10 DIAGNOSIS — I10 Essential (primary) hypertension: Secondary | ICD-10-CM | POA: Diagnosis not present

## 2018-03-23 ENCOUNTER — Other Ambulatory Visit: Payer: Self-pay | Admitting: Internal Medicine

## 2018-03-23 DIAGNOSIS — Z1231 Encounter for screening mammogram for malignant neoplasm of breast: Secondary | ICD-10-CM

## 2018-03-27 ENCOUNTER — Ambulatory Visit
Admission: RE | Admit: 2018-03-27 | Discharge: 2018-03-27 | Disposition: A | Discharge status: Home / Self Care | Payer: PPO | Source: Ambulatory Visit | Attending: Internal Medicine | Admitting: Internal Medicine

## 2018-03-27 DIAGNOSIS — Z1231 Encounter for screening mammogram for malignant neoplasm of breast: Secondary | ICD-10-CM | POA: Insufficient documentation

## 2018-03-29 ENCOUNTER — Other Ambulatory Visit: Payer: Self-pay | Admitting: Internal Medicine

## 2018-03-29 DIAGNOSIS — N6320 Unspecified lump in the left breast, unspecified quadrant: Secondary | ICD-10-CM

## 2018-03-29 DIAGNOSIS — R928 Other abnormal and inconclusive findings on diagnostic imaging of breast: Secondary | ICD-10-CM

## 2018-03-31 DIAGNOSIS — L570 Actinic keratosis: Secondary | ICD-10-CM | POA: Diagnosis not present

## 2018-03-31 DIAGNOSIS — Z85828 Personal history of other malignant neoplasm of skin: Secondary | ICD-10-CM | POA: Diagnosis not present

## 2018-03-31 DIAGNOSIS — D2271 Melanocytic nevi of right lower limb, including hip: Secondary | ICD-10-CM | POA: Diagnosis not present

## 2018-03-31 DIAGNOSIS — D2261 Melanocytic nevi of right upper limb, including shoulder: Secondary | ICD-10-CM | POA: Diagnosis not present

## 2018-03-31 DIAGNOSIS — D225 Melanocytic nevi of trunk: Secondary | ICD-10-CM | POA: Diagnosis not present

## 2018-03-31 DIAGNOSIS — Z08 Encounter for follow-up examination after completed treatment for malignant neoplasm: Secondary | ICD-10-CM | POA: Diagnosis not present

## 2018-03-31 DIAGNOSIS — D2272 Melanocytic nevi of left lower limb, including hip: Secondary | ICD-10-CM | POA: Diagnosis not present

## 2018-03-31 DIAGNOSIS — D2262 Melanocytic nevi of left upper limb, including shoulder: Secondary | ICD-10-CM | POA: Diagnosis not present

## 2018-03-31 DIAGNOSIS — X32XXXA Exposure to sunlight, initial encounter: Secondary | ICD-10-CM | POA: Diagnosis not present

## 2018-03-31 DIAGNOSIS — L821 Other seborrheic keratosis: Secondary | ICD-10-CM | POA: Diagnosis not present

## 2018-04-03 DIAGNOSIS — Z0000 Encounter for general adult medical examination without abnormal findings: Secondary | ICD-10-CM | POA: Diagnosis not present

## 2018-04-05 ENCOUNTER — Ambulatory Visit
Admission: RE | Admit: 2018-04-05 | Discharge: 2018-04-05 | Disposition: A | Discharge status: Home / Self Care | Payer: PPO | Source: Ambulatory Visit | Attending: Internal Medicine | Admitting: Internal Medicine

## 2018-04-05 DIAGNOSIS — R922 Inconclusive mammogram: Secondary | ICD-10-CM | POA: Diagnosis not present

## 2018-04-05 DIAGNOSIS — N6320 Unspecified lump in the left breast, unspecified quadrant: Secondary | ICD-10-CM

## 2018-04-05 DIAGNOSIS — R928 Other abnormal and inconclusive findings on diagnostic imaging of breast: Secondary | ICD-10-CM | POA: Diagnosis not present

## 2018-04-05 DIAGNOSIS — N6002 Solitary cyst of left breast: Secondary | ICD-10-CM | POA: Diagnosis not present

## 2018-05-18 DIAGNOSIS — H34811 Central retinal vein occlusion, right eye, with macular edema: Secondary | ICD-10-CM | POA: Diagnosis not present

## 2018-06-02 ENCOUNTER — Other Ambulatory Visit: Payer: Self-pay | Admitting: Pharmacist

## 2018-06-02 NOTE — Patient Outreach (Signed)
[facility]) Care Management  06/02/2018  Tracee Mccreery 1941-02-06 [id number]   Outreach call to Marice Potter regarding her request for follow up from the [facility] Medication Adherence [facility]. Left a HIPAA compliant message on the patient's voicemail.  Harlow Asa, PharmD, [place] [facility] [phone]

## 2018-07-25 DIAGNOSIS — H34811 Central retinal vein occlusion, right eye, with macular edema: Secondary | ICD-10-CM | POA: Diagnosis not present

## 2018-09-08 DIAGNOSIS — E7800 Pure hypercholesterolemia, unspecified: Secondary | ICD-10-CM | POA: Diagnosis not present

## 2018-09-08 DIAGNOSIS — I10 Essential (primary) hypertension: Secondary | ICD-10-CM | POA: Diagnosis not present

## 2018-09-15 DIAGNOSIS — I10 Essential (primary) hypertension: Secondary | ICD-10-CM | POA: Diagnosis not present

## 2018-09-15 DIAGNOSIS — M810 Age-related osteoporosis without current pathological fracture: Secondary | ICD-10-CM | POA: Diagnosis not present

## 2018-09-15 DIAGNOSIS — E7800 Pure hypercholesterolemia, unspecified: Secondary | ICD-10-CM | POA: Diagnosis not present

## 2018-09-15 DIAGNOSIS — R079 Chest pain, unspecified: Secondary | ICD-10-CM | POA: Diagnosis not present

## 2018-09-29 DIAGNOSIS — Z85828 Personal history of other malignant neoplasm of skin: Secondary | ICD-10-CM | POA: Diagnosis not present

## 2018-09-29 DIAGNOSIS — D2262 Melanocytic nevi of left upper limb, including shoulder: Secondary | ICD-10-CM | POA: Diagnosis not present

## 2018-09-29 DIAGNOSIS — D225 Melanocytic nevi of trunk: Secondary | ICD-10-CM | POA: Diagnosis not present

## 2018-09-29 DIAGNOSIS — L570 Actinic keratosis: Secondary | ICD-10-CM | POA: Diagnosis not present

## 2018-09-29 DIAGNOSIS — D2261 Melanocytic nevi of right upper limb, including shoulder: Secondary | ICD-10-CM | POA: Diagnosis not present

## 2018-09-29 DIAGNOSIS — D485 Neoplasm of uncertain behavior of skin: Secondary | ICD-10-CM | POA: Diagnosis not present

## 2018-09-29 DIAGNOSIS — L821 Other seborrheic keratosis: Secondary | ICD-10-CM | POA: Diagnosis not present

## 2018-09-29 DIAGNOSIS — D2272 Melanocytic nevi of left lower limb, including hip: Secondary | ICD-10-CM | POA: Diagnosis not present

## 2018-09-29 DIAGNOSIS — D2271 Melanocytic nevi of right lower limb, including hip: Secondary | ICD-10-CM | POA: Diagnosis not present

## 2018-09-29 DIAGNOSIS — Z08 Encounter for follow-up examination after completed treatment for malignant neoplasm: Secondary | ICD-10-CM | POA: Diagnosis not present

## 2019-01-15 DIAGNOSIS — H34811 Central retinal vein occlusion, right eye, with macular edema: Secondary | ICD-10-CM | POA: Diagnosis not present

## 2019-03-09 DIAGNOSIS — M1712 Unilateral primary osteoarthritis, left knee: Secondary | ICD-10-CM | POA: Diagnosis not present

## 2019-03-09 DIAGNOSIS — M25562 Pain in left knee: Secondary | ICD-10-CM | POA: Diagnosis not present

## 2019-03-27 DIAGNOSIS — E7800 Pure hypercholesterolemia, unspecified: Secondary | ICD-10-CM | POA: Diagnosis not present

## 2019-03-27 DIAGNOSIS — I10 Essential (primary) hypertension: Secondary | ICD-10-CM | POA: Diagnosis not present

## 2019-03-30 DIAGNOSIS — M810 Age-related osteoporosis without current pathological fracture: Secondary | ICD-10-CM | POA: Diagnosis not present

## 2019-03-30 DIAGNOSIS — Z1231 Encounter for screening mammogram for malignant neoplasm of breast: Secondary | ICD-10-CM | POA: Diagnosis not present

## 2019-03-30 DIAGNOSIS — E7800 Pure hypercholesterolemia, unspecified: Secondary | ICD-10-CM | POA: Diagnosis not present

## 2019-03-30 DIAGNOSIS — I10 Essential (primary) hypertension: Secondary | ICD-10-CM | POA: Diagnosis not present

## 2019-03-30 DIAGNOSIS — Z0000 Encounter for general adult medical examination without abnormal findings: Secondary | ICD-10-CM | POA: Diagnosis not present

## 2019-04-10 ENCOUNTER — Other Ambulatory Visit: Payer: Self-pay | Admitting: Internal Medicine

## 2019-04-10 DIAGNOSIS — Z1231 Encounter for screening mammogram for malignant neoplasm of breast: Secondary | ICD-10-CM

## 2019-04-30 DIAGNOSIS — L570 Actinic keratosis: Secondary | ICD-10-CM | POA: Diagnosis not present

## 2019-04-30 DIAGNOSIS — L820 Inflamed seborrheic keratosis: Secondary | ICD-10-CM | POA: Diagnosis not present

## 2019-04-30 DIAGNOSIS — L821 Other seborrheic keratosis: Secondary | ICD-10-CM | POA: Diagnosis not present

## 2019-04-30 DIAGNOSIS — X32XXXA Exposure to sunlight, initial encounter: Secondary | ICD-10-CM | POA: Diagnosis not present

## 2019-04-30 DIAGNOSIS — Z08 Encounter for follow-up examination after completed treatment for malignant neoplasm: Secondary | ICD-10-CM | POA: Diagnosis not present

## 2019-04-30 DIAGNOSIS — D485 Neoplasm of uncertain behavior of skin: Secondary | ICD-10-CM | POA: Diagnosis not present

## 2019-04-30 DIAGNOSIS — Z85828 Personal history of other malignant neoplasm of skin: Secondary | ICD-10-CM | POA: Diagnosis not present

## 2019-05-03 DIAGNOSIS — H40003 Preglaucoma, unspecified, bilateral: Secondary | ICD-10-CM | POA: Diagnosis not present

## 2019-05-28 ENCOUNTER — Ambulatory Visit
Admission: RE | Admit: 2019-05-28 | Discharge: 2019-05-28 | Disposition: A | Discharge status: Home / Self Care | Payer: PPO | Source: Ambulatory Visit | Attending: Internal Medicine | Admitting: Internal Medicine

## 2019-05-28 DIAGNOSIS — Z1231 Encounter for screening mammogram for malignant neoplasm of breast: Secondary | ICD-10-CM | POA: Insufficient documentation

## 2019-09-14 DIAGNOSIS — R8290 Unspecified abnormal findings in urine: Secondary | ICD-10-CM | POA: Diagnosis not present

## 2019-09-14 DIAGNOSIS — N3090 Cystitis, unspecified without hematuria: Secondary | ICD-10-CM | POA: Diagnosis not present

## 2019-09-14 DIAGNOSIS — E7800 Pure hypercholesterolemia, unspecified: Secondary | ICD-10-CM | POA: Diagnosis not present

## 2019-09-14 DIAGNOSIS — I10 Essential (primary) hypertension: Secondary | ICD-10-CM | POA: Diagnosis not present

## 2019-09-21 DIAGNOSIS — I10 Essential (primary) hypertension: Secondary | ICD-10-CM | POA: Diagnosis not present

## 2019-09-21 DIAGNOSIS — H6123 Impacted cerumen, bilateral: Secondary | ICD-10-CM | POA: Diagnosis not present

## 2019-09-21 DIAGNOSIS — E7800 Pure hypercholesterolemia, unspecified: Secondary | ICD-10-CM | POA: Diagnosis not present

## 2019-09-21 DIAGNOSIS — M810 Age-related osteoporosis without current pathological fracture: Secondary | ICD-10-CM | POA: Diagnosis not present

## 2019-09-28 DIAGNOSIS — Z85828 Personal history of other malignant neoplasm of skin: Secondary | ICD-10-CM | POA: Diagnosis not present

## 2019-09-28 DIAGNOSIS — D2271 Melanocytic nevi of right lower limb, including hip: Secondary | ICD-10-CM | POA: Diagnosis not present

## 2019-09-28 DIAGNOSIS — L821 Other seborrheic keratosis: Secondary | ICD-10-CM | POA: Diagnosis not present

## 2019-09-28 DIAGNOSIS — D2272 Melanocytic nevi of left lower limb, including hip: Secondary | ICD-10-CM | POA: Diagnosis not present

## 2019-09-28 DIAGNOSIS — D2261 Melanocytic nevi of right upper limb, including shoulder: Secondary | ICD-10-CM | POA: Diagnosis not present

## 2019-09-28 DIAGNOSIS — D2262 Melanocytic nevi of left upper limb, including shoulder: Secondary | ICD-10-CM | POA: Diagnosis not present

## 2019-11-02 DIAGNOSIS — H40003 Preglaucoma, unspecified, bilateral: Secondary | ICD-10-CM | POA: Diagnosis not present

## 2019-11-07 DIAGNOSIS — H40003 Preglaucoma, unspecified, bilateral: Secondary | ICD-10-CM | POA: Diagnosis not present

## 2020-01-11 DIAGNOSIS — L710 Perioral dermatitis: Secondary | ICD-10-CM | POA: Diagnosis not present

## 2020-01-11 DIAGNOSIS — I788 Other diseases of capillaries: Secondary | ICD-10-CM | POA: Diagnosis not present

## 2020-03-25 DIAGNOSIS — E7800 Pure hypercholesterolemia, unspecified: Secondary | ICD-10-CM | POA: Diagnosis not present

## 2020-03-25 DIAGNOSIS — I10 Essential (primary) hypertension: Secondary | ICD-10-CM | POA: Diagnosis not present

## 2020-03-31 DIAGNOSIS — I10 Essential (primary) hypertension: Secondary | ICD-10-CM | POA: Diagnosis not present

## 2020-03-31 DIAGNOSIS — Z0000 Encounter for general adult medical examination without abnormal findings: Secondary | ICD-10-CM | POA: Diagnosis not present

## 2020-03-31 DIAGNOSIS — E7800 Pure hypercholesterolemia, unspecified: Secondary | ICD-10-CM | POA: Diagnosis not present

## 2020-04-11 DIAGNOSIS — D2262 Melanocytic nevi of left upper limb, including shoulder: Secondary | ICD-10-CM | POA: Diagnosis not present

## 2020-04-11 DIAGNOSIS — D2271 Melanocytic nevi of right lower limb, including hip: Secondary | ICD-10-CM | POA: Diagnosis not present

## 2020-04-11 DIAGNOSIS — D225 Melanocytic nevi of trunk: Secondary | ICD-10-CM | POA: Diagnosis not present

## 2020-04-11 DIAGNOSIS — D485 Neoplasm of uncertain behavior of skin: Secondary | ICD-10-CM | POA: Diagnosis not present

## 2020-04-11 DIAGNOSIS — Z85828 Personal history of other malignant neoplasm of skin: Secondary | ICD-10-CM | POA: Diagnosis not present

## 2020-04-11 DIAGNOSIS — D2261 Melanocytic nevi of right upper limb, including shoulder: Secondary | ICD-10-CM | POA: Diagnosis not present

## 2020-04-11 DIAGNOSIS — D0462 Carcinoma in situ of skin of left upper limb, including shoulder: Secondary | ICD-10-CM | POA: Diagnosis not present

## 2020-04-11 DIAGNOSIS — L821 Other seborrheic keratosis: Secondary | ICD-10-CM | POA: Diagnosis not present

## 2020-04-21 DIAGNOSIS — C44729 Squamous cell carcinoma of skin of left lower limb, including hip: Secondary | ICD-10-CM | POA: Diagnosis not present

## 2020-04-21 DIAGNOSIS — C44629 Squamous cell carcinoma of skin of left upper limb, including shoulder: Secondary | ICD-10-CM | POA: Diagnosis not present

## 2020-05-21 DIAGNOSIS — H40003 Preglaucoma, unspecified, bilateral: Secondary | ICD-10-CM | POA: Diagnosis not present

## 2020-06-20 DIAGNOSIS — D485 Neoplasm of uncertain behavior of skin: Secondary | ICD-10-CM | POA: Diagnosis not present

## 2020-06-20 DIAGNOSIS — C44629 Squamous cell carcinoma of skin of left upper limb, including shoulder: Secondary | ICD-10-CM | POA: Diagnosis not present

## 2020-07-08 DIAGNOSIS — C44629 Squamous cell carcinoma of skin of left upper limb, including shoulder: Secondary | ICD-10-CM | POA: Diagnosis not present

## 2020-09-22 DIAGNOSIS — E7800 Pure hypercholesterolemia, unspecified: Secondary | ICD-10-CM | POA: Diagnosis not present

## 2020-09-22 DIAGNOSIS — Z0000 Encounter for general adult medical examination without abnormal findings: Secondary | ICD-10-CM | POA: Diagnosis not present

## 2020-09-22 DIAGNOSIS — I10 Essential (primary) hypertension: Secondary | ICD-10-CM | POA: Diagnosis not present

## 2020-09-29 DIAGNOSIS — E7800 Pure hypercholesterolemia, unspecified: Secondary | ICD-10-CM | POA: Diagnosis not present

## 2020-09-29 DIAGNOSIS — N183 Chronic kidney disease, stage 3 unspecified: Secondary | ICD-10-CM | POA: Diagnosis not present

## 2020-09-29 DIAGNOSIS — M810 Age-related osteoporosis without current pathological fracture: Secondary | ICD-10-CM | POA: Diagnosis not present

## 2020-09-29 DIAGNOSIS — G2581 Restless legs syndrome: Secondary | ICD-10-CM | POA: Diagnosis not present

## 2020-09-29 DIAGNOSIS — I129 Hypertensive chronic kidney disease with stage 1 through stage 4 chronic kidney disease, or unspecified chronic kidney disease: Secondary | ICD-10-CM | POA: Diagnosis not present

## 2020-10-01 DIAGNOSIS — M810 Age-related osteoporosis without current pathological fracture: Secondary | ICD-10-CM | POA: Diagnosis not present

## 2020-10-10 DIAGNOSIS — Z85828 Personal history of other malignant neoplasm of skin: Secondary | ICD-10-CM | POA: Diagnosis not present

## 2020-10-10 DIAGNOSIS — D485 Neoplasm of uncertain behavior of skin: Secondary | ICD-10-CM | POA: Diagnosis not present

## 2020-10-10 DIAGNOSIS — Z08 Encounter for follow-up examination after completed treatment for malignant neoplasm: Secondary | ICD-10-CM | POA: Diagnosis not present

## 2020-10-10 DIAGNOSIS — D0461 Carcinoma in situ of skin of right upper limb, including shoulder: Secondary | ICD-10-CM | POA: Diagnosis not present

## 2020-10-10 DIAGNOSIS — D2262 Melanocytic nevi of left upper limb, including shoulder: Secondary | ICD-10-CM | POA: Diagnosis not present

## 2020-10-10 DIAGNOSIS — L821 Other seborrheic keratosis: Secondary | ICD-10-CM | POA: Diagnosis not present

## 2020-10-10 DIAGNOSIS — D2272 Melanocytic nevi of left lower limb, including hip: Secondary | ICD-10-CM | POA: Diagnosis not present

## 2020-10-10 DIAGNOSIS — D2261 Melanocytic nevi of right upper limb, including shoulder: Secondary | ICD-10-CM | POA: Diagnosis not present

## 2020-10-20 ENCOUNTER — Other Ambulatory Visit: Payer: Self-pay

## 2020-10-20 ENCOUNTER — Emergency Department: Payer: PPO

## 2020-10-20 ENCOUNTER — Emergency Department
Admission: EM | Admit: 2020-10-20 | Discharge: 2020-10-20 | Disposition: A | Discharge status: Home / Self Care | Payer: PPO | Attending: Emergency Medicine | Admitting: Emergency Medicine

## 2020-10-20 DIAGNOSIS — Z79899 Other long term (current) drug therapy: Secondary | ICD-10-CM | POA: Insufficient documentation

## 2020-10-20 DIAGNOSIS — Z87891 Personal history of nicotine dependence: Secondary | ICD-10-CM | POA: Diagnosis not present

## 2020-10-20 DIAGNOSIS — I10 Essential (primary) hypertension: Secondary | ICD-10-CM | POA: Insufficient documentation

## 2020-10-20 DIAGNOSIS — R0789 Other chest pain: Secondary | ICD-10-CM | POA: Diagnosis not present

## 2020-10-20 DIAGNOSIS — D0461 Carcinoma in situ of skin of right upper limb, including shoulder: Secondary | ICD-10-CM | POA: Diagnosis not present

## 2020-10-20 DIAGNOSIS — R079 Chest pain, unspecified: Secondary | ICD-10-CM | POA: Diagnosis not present

## 2020-10-20 LAB — BASIC METABOLIC PANEL
Anion gap: 11 (ref 5–15)
BUN: 14 mg/dL (ref 8–23)
CO2: 22 mmol/L (ref 22–32)
Calcium: 9.7 mg/dL (ref 8.9–10.3)
Chloride: 104 mmol/L (ref 98–111)
Creatinine, Ser: 0.87 mg/dL (ref 0.44–1.00)
GFR, Estimated: >60 mL/min (ref >60)
Glucose, Bld: 120 mg/dL — ABNORMAL HIGH (ref 70–99)
Potassium: 4 mmol/L (ref 3.5–5.1)
Sodium: 137 mmol/L (ref 135–145)

## 2020-10-20 LAB — CBC
HCT: 42.4 % (ref 36.0–46.0)
Hemoglobin: 13.8 g/dL (ref 12.0–15.0)
MCH: 29.2 pg (ref 26.0–34.0)
MCHC: 32.5 g/dL (ref 30.0–36.0)
MCV: 89.8 fL (ref 80.0–100.0)
Platelets: 208 10*3/uL (ref 150–400)
RBC: 4.72 MIL/uL (ref 3.87–5.11)
RDW: 13.3 % (ref 11.5–15.5)
WBC: 6.1 10*3/uL (ref 4.0–10.5)
nRBC: 0 % (ref 0.0–0.2)

## 2020-10-20 LAB — TROPONIN I (HIGH SENSITIVITY): Troponin I (High Sensitivity): 6 ng/L (ref <18)

## 2020-10-20 NOTE — ED Provider Notes (Signed)
[facility] Emergency Department Provider Note   ____________________________________________    I have reviewed the triage vital signs and the nursing notes.   HISTORY  Chief Complaint Chest Pain     HPI Kathryn Love is a 80 y.o. female with history as detailed below who presents with complaints of chest tightness, now resolved.  Patient reports this is been intermittent over the last week, does not seem to be exertional in nature.  Denies shortness of breath.  No pleurisy.  No calf pain or swelling.  No nausea vomiting or diaphoresis.  No history of heart disease.  Does not smoke.  Has not take anything for this.  Last felt symptoms at 4 AM   Past Medical History:  Diagnosis Date  . Essential (primary) hypertension 08/14/2014   Last Assessment & Plan:  Blood pressure has been controlled without significant dizzyness or associated fatigue. Taking antihypertensives as directed without difficulty.    Marland Kitchen HLD (hyperlipidemia) 08/14/2014   Last Assessment & Plan:  Has been working on a low fat diet and no myalgia's are present.    . Recurrent UTI   . Restless leg 08/14/2014   Last Assessment & Plan:  Gabapentin and Lorrin Mais are controlling symptoms reasonably well.      Patient Active Problem List   Diagnosis Date Noted  . Essential (primary) hypertension 08/14/2014  . HLD (hyperlipidemia) 08/14/2014  . Restless leg 08/14/2014    Past Surgical History:  Procedure Laterality Date  . ABDOMINOPLASTY  1997  . BREAST CYST EXCISION Right 1960  . COLONOSCOPY WITH PROPOFOL N/A 09/15/2015   Procedure: COLONOSCOPY WITH PROPOFOL;  Surgeon: Josefine Class, MD;  Location: [facility] ENDOSCOPY;  Service: Endoscopy;  Laterality: N/A;  . TONSILLECTOMY  1947    Prior to Admission medications   Medication Sig Start Date End Date Taking? Authorizing Provider  amLODipine (NORVASC) 2.5 MG tablet  06/20/15   [provider]  calcium carbonate (OS-CAL - DOSED IN MG  OF ELEMENTAL CALCIUM) 1250 (500 Ca) MG tablet Take by mouth.    [provider]  gabapentin (NEURONTIN) 100 MG capsule Take by mouth.    [provider]  lisinopril (PRINIVIL,ZESTRIL) 40 MG tablet  06/20/15   [provider]  Magnesium 250 MG TABS Take by mouth.    [provider]  Multiple Vitamin (MULTIVITAMIN) capsule Take 1 capsule by mouth daily.    [provider]  nitrofurantoin (MACRODANTIN) 50 MG capsule Take by mouth.    [provider]  polyethylene glycol-electrolytes (NULYTELY/GOLYTELY) 420 g solution Take by mouth. 08/29/15   [provider]  vitamin B-12 (CYANOCOBALAMIN) 100 MCG tablet Take 100 mcg by mouth daily.    [provider]  zolpidem (AMBIEN) 10 MG tablet Take by mouth. 02/11/15   [provider]     Allergies Patient has no known allergies.  Family History  Problem Relation Age of Onset  . Bladder Cancer Mother   . Breast cancer Maternal Grandmother 33       Unsure  . Prostate cancer Neg Hx   . Kidney cancer Neg Hx     Social History Social History   Tobacco Use  . Smoking status: Former Research scientist (life sciences)  . Smokeless tobacco: Never Used  Substance Use Topics  . Alcohol use: Yes    Alcohol/week: 0.0 standard drinks  . Drug use: No    Review of Systems  Constitutional: No fever/chills Eyes: No visual changes.  ENT: No sore throat. Cardiovascular:  As above Respiratory: Denies shortness of breath. Gastrointestinal: No abdominal pain.  No nausea, no vomiting.   Genitourinary: Negative for dysuria. Musculoskeletal: Negative for back pain. Skin: Negative for rash. Neurological: Negative for headaches or weakness   ____________________________________________   PHYSICAL EXAM:  VITAL SIGNS: ED Triage Vitals  Enc Vitals Group     BP 10/20/20 1352 (!) 156/86     Pulse Rate 10/20/20 1352 73     Resp 10/20/20 1352 18     Temp 10/20/20 1352 98 F (36.7 C)     Temp src --       SpO2 10/20/20 1352 97 %     Weight 10/20/20 1357 54.4 kg (120 lb)     Height 10/20/20 1357 1.499 m (4\' 11" )     Head Circumference --      Peak Flow --      Pain Score 10/20/20 1357 0     Pain Loc --      Pain Edu? --      Excl. in [place]? --     Constitutional: Alert and oriented. No acute distress.  Nose: No congestion/rhinnorhea. Mouth/Throat: Mucous membranes are moist.    Cardiovascular: Normal rate, regular rhythm. Grossly normal heart sounds.  Good peripheral circulation. Respiratory: Normal respiratory effort.  No retractions. Lungs CTAB. Gastrointestinal: Soft and nontender. No distention.  No CVA tenderness.  Musculoskeletal: No lower extremity tenderness nor edema.  Warm and well perfused Neurologic:  Normal speech and language. No gross focal neurologic deficits are appreciated.  Skin:  Skin is warm, dry and intact. No rash noted. Psychiatric: Mood and affect are normal. Speech and behavior are normal.  ____________________________________________   LABS (all labs ordered are listed, but only abnormal results are displayed)  Labs Reviewed  BASIC METABOLIC PANEL - Abnormal; Notable for the following components:      Result Value   Glucose, Bld 120 (*)    All other components within normal limits  CBC  TROPONIN I (HIGH SENSITIVITY)   ____________________________________________  EKG  ED ECG REPORT I, Lavonia Drafts, the attending physician, personally viewed and interpreted this ECG.  Date: 10/20/2020  Rhythm: normal sinus rhythm QRS Axis: normal Intervals: normal ST/T Wave abnormalities: normal Narrative Interpretation: no evidence of acute ischemia  ____________________________________________  RADIOLOGY  Chest x-ray viewed by me, no infiltrate or effusion ____________________________________________   PROCEDURES  Procedure(s) performed: No  Procedures   Critical Care performed: No ____________________________________________   INITIAL  IMPRESSION / ASSESSMENT AND PLAN / ED COURSE  Pertinent labs & imaging results that were available during my care of the patient were reviewed by me and considered in my medical decision making (see chart for details).  Patient well-appearing and asymptomatic in the emergency department, she is insistent that her chest discomfort was likely related to stress or anxiety.  I did convince her to stay for lab/troponin/EKG and chest x-ray  Her high sensitivity troponin is normal, lab work is unremarkable, chest x-ray is normal  She remains asymptomatic, will discharge with close outpatient follow-up with cardiology    ____________________________________________   FINAL CLINICAL IMPRESSION(S) / ED DIAGNOSES  Final diagnoses:  Atypical chest pain        Note:  This document was prepared using Dragon voice recognition software and may include unintentional dictation errors.   Lavonia Drafts, MD 10/20/20 [phone]

## 2020-10-20 NOTE — ED Triage Notes (Signed)
Pt come with c/o HTN and chest tightness. Pt states this started last week and seemed to get better. Pt states no SOB, N/V/D.  Pt states she doesn't know if this may be a panic attack.  Pt denies any current CP

## 2020-11-10 ENCOUNTER — Ambulatory Visit: Payer: PPO | Admitting: Cardiovascular Disease

## 2020-11-10 ENCOUNTER — Other Ambulatory Visit: Payer: Self-pay

## 2020-11-10 ENCOUNTER — Encounter: Payer: Self-pay | Admitting: Cardiovascular Disease

## 2020-11-10 VITALS — BP 148/80 | HR 67 | Ht 59.0 in | Wt 121.0 lb

## 2020-11-10 DIAGNOSIS — I10 Essential (primary) hypertension: Secondary | ICD-10-CM | POA: Diagnosis not present

## 2020-11-10 DIAGNOSIS — E782 Mixed hyperlipidemia: Secondary | ICD-10-CM

## 2020-11-10 DIAGNOSIS — R079 Chest pain, unspecified: Secondary | ICD-10-CM | POA: Diagnosis not present

## 2020-11-10 NOTE — Progress Notes (Signed)
Cardiology Office Note  Date:  11/10/2020   ID:  Ymani, Porcher 24-May-1941, MRN [id number]  PCP:  Kathryn Ruths, MD   Chief Complaint  Patient presents with  . New Patient (Initial Visit)    Follow up from [facility] ER; chest pain. Patient c/o elevated blood pressure, chest pain, and shortness of breath when bending over. Medications reviewed by the patient verbally.     HPI:  [name] is a 80 year old woman with past medical history of Hypertension Hyperlipidemia former smoker,quit  1980, smoked 15 years Recent presentation to the emergency room October 20, 2020 for chest pain Who presents by referral from Dr. Ouida Love for evaluation of her chest pain symptoms  Reports at baseline very active, does regular exercise 2 to 3 days a week Goes to the gym with her husband Denies any chest pain when she is at the gym Works out with treadmill, weights  Blood pressure well controlled at home typically 135/75  Atypical chest pain 3 epsiodes 1: went to ER 2 yesterday Am Today had little bit of discomfort Husband reports she has had 5  episodes "has panic attack" when she has any discomfort at all  Evaluation in the emergency room reviewed Felt to be atypical in nature, cardiac work-up negative Chest x-ray mentions aortic atherosclerosis  Lab work reviewed Total cholesterol 169 LDL 81  Reports having other issues which she attributes to hormones Night sweats  On and off protonix Takes it for a month and off a month Wonders if her symptoms could be secondary to GERD  EKG personally reviewed by myself on todays visit NSR rate 67 bpm,   T wave inversion V5 to V6   PMH:   has a past medical history of Essential (primary) hypertension (08/14/2014), HLD (hyperlipidemia) (08/14/2014), Recurrent UTI, and Restless leg (08/14/2014).  PSH:    Past Surgical History:  Procedure Laterality Date  . ABDOMINOPLASTY  1997  . BREAST CYST EXCISION Right 1960  . COLONOSCOPY WITH  PROPOFOL N/A 09/15/2015   Procedure: COLONOSCOPY WITH PROPOFOL;  Surgeon: Kathryn Class, MD;  Location: [facility] ENDOSCOPY;  Service: Endoscopy;  Laterality: N/A;  . TONSILLECTOMY  1947    Current Outpatient Medications  Medication Sig Dispense Refill  . amLODipine (NORVASC) 2.5 MG tablet Take 2.5 mg by mouth daily.    . calcium carbonate (OS-CAL - DOSED IN MG OF ELEMENTAL CALCIUM) 1250 (500 Ca) MG tablet Take 1 tablet by mouth daily with breakfast.    . Cholecalciferol 50 MCG (2000 UT) TABS Take by mouth daily.    Marland Kitchen gabapentin (NEURONTIN) 100 MG capsule Take 100 mg by mouth at bedtime.    Marland Kitchen losartan (COZAAR) 100 MG tablet Take 1 tablet by mouth daily.    . Magnesium 250 MG TABS Take 250 mg by mouth daily.    . Multiple Vitamin (MULTIVITAMIN) capsule Take 1 capsule by mouth daily.    . nitrofurantoin (MACRODANTIN) 50 MG capsule Take 50 mg by mouth as needed.    . pantoprazole (PROTONIX) 40 MG tablet Take 40 mg by mouth daily as needed.    . vitamin B-12 (CYANOCOBALAMIN) 100 MCG tablet Take 100 mcg by mouth daily.    . zaleplon (SONATA) 10 MG capsule Take 10 mg by mouth at bedtime as needed.    . zaleplon (SONATA) 5 MG capsule Take 5 mg by mouth at bedtime as needed for sleep.    Marland Kitchen zolpidem (AMBIEN) 10 MG tablet Take by mouth. (Patient not taking: Reported  on 11/10/2020)     No current facility-administered medications for this visit.    Allergies:   Patient has no known allergies.   Social History:  The patient  reports that she has quit smoking. She has never used smokeless tobacco. She reports current alcohol use. She reports that she does not use drugs.   Family History:   family history includes Bladder Cancer in her mother; Breast cancer (age of onset: 23) in her maternal grandmother.    Review of Systems: Review of Systems  Constitutional: Negative.   HENT: Negative.   Respiratory: Negative.   Cardiovascular: Negative.   Gastrointestinal: Negative.   Musculoskeletal:  Negative.   Neurological: Negative.   Psychiatric/Behavioral: Negative.   All other systems reviewed and are negative.   PHYSICAL EXAM: VS:  BP (!) 148/80 (BP Location: Right Arm, Patient Position: Sitting, Cuff Size: Normal)   Pulse 67   Ht 4\' 11"  (1.499 m)   Wt 121 lb (54.9 kg)   SpO2 98%   BMI 24.44 kg/m  , BMI Body mass index is 24.44 kg/m. GEN: Well nourished, well developed, in no acute distress HEENT: normal Neck: no JVD, carotid bruits, or masses Cardiac: RRR; no murmurs, rubs, or gallops,no edema  Respiratory:  clear to auscultation bilaterally, normal work of breathing GI: soft, nontender, nondistended, + BS MS: no deformity or atrophy Skin: warm and dry, no rash Neuro:  Strength and sensation are intact Psych: euthymic mood, full affect   Recent Labs: 10/20/2020: BUN 14; Creatinine, Ser 0.87; Hemoglobin 13.8; Platelets 208; Potassium 4.0; Sodium 137    Lipid Panel No results found for: CHOL, HDL, LDLCALC, TRIG    Wt Readings from Last 3 Encounters:  11/10/20 121 lb (54.9 kg)  10/20/20 120 lb (54.4 kg)  04/02/16 119 lb (54 kg)     ASSESSMENT AND PLAN:  Problem List Items Addressed This Visit      Cardiology Problems   Essential (primary) hypertension   Relevant Medications   losartan (COZAAR) 100 MG tablet   Other Relevant Orders   EKG 12-Lead   HLD (hyperlipidemia)   Relevant Medications   losartan (COZAAR) 100 MG tablet    Other Visit Diagnoses    Chest pain of uncertain etiology    -  Primary   Relevant Orders   EKG 12-Lead     Chest pain Atypical in nature, Recent work-up in the emergency room unrevealing Recommend she take her PPI on a daily basis rather than as needed Long discussion concerning various imaging studies that could be performed including calcium scoring, echocardiogram, stress testing -She would like to have CT coronary calcium performed for risk stratification If symptoms get worse despite taking PPI, she will call our  office for further evaluation  Essential hypertension Reports is well controlled at home No medication changes made at this time, anxious on today's visit Tolerating losartan and low-dose amlodipine  Hyperlipidemia Reasonably well-controlled numbers on no medication Calcium score above to help guide management   Total encounter time more than 60 minutes  Greater than 50% was spent in counseling and coordination of care with the patient    Signed, Esmond Plants, M.D., Ph.D. [place], [place]

## 2020-11-10 NOTE — Patient Instructions (Addendum)
Please call if you would like a : Echocardiogram Stress testing  Medication Instructions:  Please take stomach medication daily  PPI (protonix, please take everyday)  If you need a refill on your cardiac medications before your next appointment, please call your pharmacy.    Lab work: No new labs needed  Testing/Procedures: We will call with the results  We will order a Coronary calcium score  Chest pain $99 out of pocket expense  at our [facility] in [place]  This procedure uses special x-ray equipment to produce pictures of the coronary arteries to determine if they are blocked or narrowed by the buildup of plaque - an indicator for atherosclerosis or coronary artery disease (CAD).  Please call [phone] to schedule at your earliest convince   [address], [place] [address]   Follow-Up: At [facility], you and your health needs are our priority.  As part of our continuing mission to provide you with exceptional heart care, we have created designated Provider Care Teams.  These Care Teams include your primary Cardiologist (physician) and Advanced Practice Providers (APPs -  Physician Assistants and Nurse Practitioners) who all work together to provide you with the care you need, when you need it.  . You will need a follow up appointment as needed  Please call the office when you would like to be seen [phone]  . Providers on your designated Care Team:   . Murray Hodgkins, NP . Christell Faith, PA-C . Marrianne Mood, PA-C  Any Other Special Instructions Will Be Listed Below (If Applicable).  COVID-19 Vaccine Information can be found at: ShippingScam.co.uk For questions related to vaccine distribution or appointments, please email [email or web address] or call [phone].

## 2020-11-14 ENCOUNTER — Encounter: Payer: Self-pay | Admitting: Emergency Medicine

## 2020-11-14 ENCOUNTER — Emergency Department: Payer: PPO

## 2020-11-14 ENCOUNTER — Other Ambulatory Visit: Payer: Self-pay

## 2020-11-14 ENCOUNTER — Ambulatory Visit
Admission: RE | Admit: 2020-11-14 | Discharge: 2020-11-14 | Disposition: A | Discharge status: Home / Self Care | Payer: PPO | Source: Ambulatory Visit | Attending: Cardiovascular Disease | Admitting: Cardiovascular Disease

## 2020-11-14 DIAGNOSIS — I10 Essential (primary) hypertension: Secondary | ICD-10-CM | POA: Diagnosis not present

## 2020-11-14 DIAGNOSIS — R079 Chest pain, unspecified: Secondary | ICD-10-CM | POA: Insufficient documentation

## 2020-11-14 DIAGNOSIS — R0789 Other chest pain: Secondary | ICD-10-CM | POA: Diagnosis not present

## 2020-11-14 DIAGNOSIS — Z87891 Personal history of nicotine dependence: Secondary | ICD-10-CM | POA: Diagnosis not present

## 2020-11-14 DIAGNOSIS — Z79899 Other long term (current) drug therapy: Secondary | ICD-10-CM | POA: Diagnosis not present

## 2020-11-14 LAB — BASIC METABOLIC PANEL
Anion gap: 9 (ref 5–15)
BUN: 23 mg/dL (ref 8–23)
CO2: 25 mmol/L (ref 22–32)
Calcium: 9.7 mg/dL (ref 8.9–10.3)
Chloride: 104 mmol/L (ref 98–111)
Creatinine, Ser: 0.95 mg/dL (ref 0.44–1.00)
GFR, Estimated: >60 mL/min (ref >60)
Glucose, Bld: 228 mg/dL — ABNORMAL HIGH (ref 70–99)
Potassium: 3.4 mmol/L — ABNORMAL LOW (ref 3.5–5.1)
Sodium: 138 mmol/L (ref 135–145)

## 2020-11-14 LAB — CBC
HCT: 40.5 % (ref 36.0–46.0)
Hemoglobin: 13.5 g/dL (ref 12.0–15.0)
MCH: 29.4 pg (ref 26.0–34.0)
MCHC: 33.3 g/dL (ref 30.0–36.0)
MCV: 88.2 fL (ref 80.0–100.0)
Platelets: 224 10*3/uL (ref 150–400)
RBC: 4.59 MIL/uL (ref 3.87–5.11)
RDW: 13.2 % (ref 11.5–15.5)
WBC: 6.2 10*3/uL (ref 4.0–10.5)
nRBC: 0 % (ref 0.0–0.2)

## 2020-11-14 LAB — TROPONIN I (HIGH SENSITIVITY): Troponin I (High Sensitivity): 6 ng/L (ref <18)

## 2020-11-14 NOTE — ED Triage Notes (Signed)
Pt presents to ER from home with complaints of chest pressure since 4 pm and HTN. Pt denies any shortness of breath, denis any N/V/D. Pt reports chest pressure comes and goes. Pt talks in complete sentences no distress noted

## 2020-11-15 ENCOUNTER — Emergency Department
Admission: EM | Admit: 2020-11-15 | Discharge: 2020-11-15 | Disposition: A | Discharge status: Home / Self Care | Payer: PPO | Attending: Emergency Medicine | Admitting: Emergency Medicine

## 2020-11-15 DIAGNOSIS — R0789 Other chest pain: Secondary | ICD-10-CM

## 2020-11-15 DIAGNOSIS — I10 Essential (primary) hypertension: Secondary | ICD-10-CM

## 2020-11-15 LAB — TROPONIN I (HIGH SENSITIVITY): Troponin I (High Sensitivity): 7 ng/L (ref <18)

## 2020-11-15 NOTE — ED Provider Notes (Signed)
[facility] Emergency Department Provider Note  ____________________________________________   Event Date/Time   First MD Initiated Contact with Patient 11/15/20 [phone]     (approximate)  I have reviewed the triage vital signs and the nursing notes.   HISTORY  Chief Complaint Chest Pain    HPI Kathryn Love is a 80 y.o. female with medical history as listed below who presents for evaluation of chest discomfort.  This started early this afternoon but is actually been going on for an extended period of time, more than a few weeks.  She has been in the emergency department about a month ago, has had subsequent visits with her PCP, and saw cardiology (Dr. Rockey Situ)  and has had a coronary CT scan yesterday.  She presents for recurrent symptoms of chest pressure as well as high blood pressure.  She said that each time she checks her blood pressure, she is concerned because it keeps going up and has been at least in the [id number] systolic.  He has come down a little bit since being here.  She has been told in the past that anxiety is likely contributing to her symptoms.  She said it is tough to ignore at night when she is not feeling right and her blood pressure numbers keep going up.  She currently is asymptomatic.  She denies shortness of breath, nausea, vomiting, abdominal pain, focal weakness or numbness, and dysuria.  Nothing in particular seems to make her symptoms better or worse.        Past Medical History:  Diagnosis Date  . Essential (primary) hypertension 08/14/2014   Last Assessment & Plan:  Blood pressure has been controlled without significant dizzyness or associated fatigue. Taking antihypertensives as directed without difficulty.    Marland Kitchen HLD (hyperlipidemia) 08/14/2014   Last Assessment & Plan:  Has been working on a low fat diet and no myalgia's are present.    . Recurrent UTI   . Restless leg 08/14/2014   Last Assessment & Plan:  Gabapentin and Lorrin Mais are  controlling symptoms reasonably well.      Patient Active Problem List   Diagnosis Date Noted  . Essential (primary) hypertension 08/14/2014  . HLD (hyperlipidemia) 08/14/2014  . Restless leg 08/14/2014    Past Surgical History:  Procedure Laterality Date  . ABDOMINOPLASTY  1997  . BREAST CYST EXCISION Right 1960  . COLONOSCOPY WITH PROPOFOL N/A 09/15/2015   Procedure: COLONOSCOPY WITH PROPOFOL;  Surgeon: Josefine Class, MD;  [facility] ENDOSCOPY;  Service: Endoscopy;  Laterality: N/A;  . TONSILLECTOMY  1947    Prior to Admission medications   Medication Sig Start Date End Date Taking? Authorizing Provider  amLODipine (NORVASC) 2.5 MG tablet Take 2.5 mg by mouth daily. 06/20/15   [provider]  calcium carbonate (OS-CAL - DOSED IN MG OF ELEMENTAL CALCIUM) 1250 (500 Ca) MG tablet Take 1 tablet by mouth daily with breakfast.    [provider]  Cholecalciferol 50 MCG (2000 UT) TABS Take by mouth daily.    [provider]  gabapentin (NEURONTIN) 100 MG capsule Take 100 mg by mouth at bedtime.    [provider]  losartan (COZAAR) 100 MG tablet Take 1 tablet by mouth daily. 11/05/20   [provider]  Magnesium 250 MG TABS Take 250 mg by mouth daily.    [provider]  Multiple Vitamin (MULTIVITAMIN) capsule Take 1 capsule by mouth daily.    [provider]  nitrofurantoin (MACRODANTIN) 50 MG  capsule Take 50 mg by mouth as needed.    [provider]  pantoprazole (PROTONIX) 40 MG tablet Take 40 mg by mouth daily as needed. 03/31/20 03/31/21  [provider]  vitamin B-12 (CYANOCOBALAMIN) 100 MCG tablet Take 100 mcg by mouth daily.    [provider]  zaleplon (SONATA) 10 MG capsule Take 10 mg by mouth at bedtime as needed. 09/29/20   [provider]  zaleplon (SONATA) 5 MG capsule Take 5 mg by mouth at bedtime as needed for sleep.    [provider]  zolpidem (AMBIEN) 10 MG  tablet Take by mouth. Patient not taking: Reported on 11/10/2020 02/11/15   [provider]    Allergies Patient has no known allergies.  Family History  Problem Relation Age of Onset  . Bladder Cancer Mother   . Breast cancer Maternal Grandmother 36       Unsure  . Prostate cancer Neg Hx   . Kidney cancer Neg Hx     Social History Social History   Tobacco Use  . Smoking status: Former Research scientist (life sciences)  . Smokeless tobacco: Never Used  Substance Use Topics  . Alcohol use: Yes    Alcohol/week: 0.0 standard drinks  . Drug use: No    Review of Systems Constitutional: No fever/chills Eyes: No visual changes. ENT: No sore throat. Cardiovascular: Chest pressure, now resolved Respiratory: Denies shortness of breath. Gastrointestinal: No abdominal pain.  No nausea, no vomiting.  No diarrhea.  No constipation. Genitourinary: Negative for dysuria. Musculoskeletal: Negative for neck pain.  Negative for back pain. Integumentary: Negative for rash. Neurological: Negative for headaches, focal weakness or numbness.   ____________________________________________   PHYSICAL EXAM:  VITAL SIGNS: ED Triage Vitals  Enc Vitals Group     BP 11/14/20 2207 (!) 158/86     Pulse Rate 11/14/20 2207 88     Resp 11/14/20 2207 20     Temp 11/14/20 2207 98.5 F (36.9 C)     Temp Source 11/14/20 2207 Oral     SpO2 11/14/20 2207 98 %     Weight 11/14/20 2209 54.9 kg (121 lb)     Height 11/14/20 2209 1.499 m (4\' 11" )     Head Circumference --      Peak Flow --      Pain Score 11/14/20 2209 4     Pain Loc --      Pain Edu? --      Excl. in [place]? --     Constitutional: Alert and oriented.  No acute distress. Eyes: Conjunctivae are normal.  Head: Atraumatic. Nose: No congestion/rhinnorhea. Mouth/Throat: Patient is wearing a mask. Neck: No stridor.  No meningeal signs.   Cardiovascular: Normal rate, regular rhythm. Good peripheral circulation. Respiratory: Normal respiratory effort.  No  retractions. Gastrointestinal: Soft and nontender. No distention.  Musculoskeletal: No lower extremity tenderness nor edema. No gross deformities of extremities. Neurologic:  Normal speech and language. No gross focal neurologic deficits are appreciated.  Skin:  Skin is warm, dry and intact. Psychiatric: Mood and affect are normal. Speech and behavior are normal.  ____________________________________________   LABS (all labs ordered are listed, but only abnormal results are displayed)  Labs Reviewed  BASIC METABOLIC PANEL - Abnormal; Notable for the following components:      Result Value   Potassium 3.4 (*)    Glucose, Bld 228 (*)    All other components within normal limits  CBC  TROPONIN I (HIGH SENSITIVITY)  TROPONIN I (HIGH  SENSITIVITY)   ____________________________________________  EKG  ED ECG REPORT I, Hinda Kehr, the attending physician, personally viewed and interpreted this ECG.  Date: 11/14/2020 EKG Time: 22: 18 Rate: 81 Rhythm: normal sinus rhythm QRS Axis: Left axis deviation Intervals: normal ST/T Wave abnormalities: Non-specific ST segment / T-wave changes, but no clear evidence of acute ischemia. Narrative Interpretation: no definitive evidence of acute ischemia; does not meet STEMI criteria.   ____________________________________________  RADIOLOGY I, Hinda Kehr, personally viewed and evaluated these images (plain radiographs) as part of my medical decision making, as well as reviewing the written report by the radiologist.  ED MD interpretation: No acute abnormalities identified on two-view chest x-ray.  Official radiology report(s): DG Chest 2 View  Result Date: 11/14/2020 CLINICAL DATA:  Chest pressure. EXAM: CHEST - 2 VIEW COMPARISON:  October 20, 2020 FINDINGS: Chronic appearing increased interstitial lung markings are seen. There is no evidence of acute infiltrate, pleural effusion or pneumothorax. The heart size and mediastinal contours are  within normal limits. Degenerative changes seen throughout the thoracic spine. IMPRESSION: No active cardiopulmonary disease. Electronically Signed   By: Virgina Norfolk M.D.   On: 11/14/2020 23:17       ____________________________________________   PROCEDURES   Procedure(s) performed (including Critical Care):  Procedures   ____________________________________________   INITIAL IMPRESSION / MDM / [place] / ED COURSE  As part of my medical decision making, I reviewed the following data within the [facility] notes reviewed and incorporated, Labs reviewed , EKG interpreted , Old EKG reviewed, Old chart reviewed, Radiograph reviewed  and Notes from prior ED visits   Differential diagnosis includes, but is not limited to, anxiety, atypical chest pain, angina, unstable angina or other ACS, less likely pneumonia or PE.  Patient has had multiple evaluations both in the ED and as an outpatient including by cardiology.  She also recently had a cardiac CT.   Her vital signs are stable other than some mild hypertension.  CBC normal, BMP normal, high-sensitivity troponin normal x2.  I reviewed the medical record and see that she has a degree of anxiety and I believe that is definitely contributing to her symptoms tonight.  We had a good talk about her symptoms.  She is low risk for currently suffering from ACS.  No sign of ischemia on her EKG.  I personally reviewed the patient's imaging and agree with the radiologist's interpretation that there are no acute abnormalities on x-ray.  Her lab work is reassuring.  The patient feels much better after her evaluation tonight and plans to follow-up with Dr. Ouida Sills as an outpatient.  I reiterated my usual and customary return precautions and she is comfortable going home.           ____________________________________________  FINAL CLINICAL IMPRESSION(S) / ED DIAGNOSES  Final diagnoses:  Atypical  chest pain  Primary hypertension     MEDICATIONS GIVEN DURING THIS VISIT:  Medications - No data to display   ED Discharge Orders    None      *Please note:  Emy Angevine was evaluated in Emergency Department on 11/15/2020 for the symptoms described in the history of present illness. She was evaluated in the context of the global COVID-19 pandemic, which necessitated consideration that the patient might be at risk for infection with the SARS-CoV-2 virus that causes COVID-19. Institutional protocols and algorithms that pertain to the evaluation of patients at risk for COVID-19 are in a state of rapid change based  on information released by regulatory bodies including the CDC and federal and state organizations. These policies and algorithms were followed during the patient's care in the ED.  Some ED evaluations and interventions may be delayed as a result of limited staffing during and after the pandemic.*  Note:  This document was prepared using Dragon voice recognition software and may include unintentional dictation errors.   Hinda Kehr, MD 11/15/20 [phone]

## 2020-11-15 NOTE — Discharge Instructions (Addendum)
Your workup in the Emergency Department today was reassuring.  We did not find any specific abnormalities.  We recommend you drink plenty of fluids, take your regular medications and/or any new ones prescribed today, and follow up with the doctor(s) listed in these documents as recommended.  Return to the Emergency Department if you develop new or worsening symptoms that concern you.  

## 2020-11-17 DIAGNOSIS — H40003 Preglaucoma, unspecified, bilateral: Secondary | ICD-10-CM | POA: Diagnosis not present

## 2020-11-21 ENCOUNTER — Ambulatory Visit: Payer: PPO | Admitting: Physician Assistant

## 2020-11-24 ENCOUNTER — Ambulatory Visit: Payer: PPO | Admitting: Physician Assistant

## 2020-11-24 ENCOUNTER — Other Ambulatory Visit: Payer: Self-pay

## 2020-11-24 ENCOUNTER — Encounter: Payer: Self-pay | Admitting: Physician Assistant

## 2020-11-24 VITALS — BP 138/68 | HR 59 | Ht 59.0 in | Wt 124.0 lb

## 2020-11-24 DIAGNOSIS — R079 Chest pain, unspecified: Secondary | ICD-10-CM | POA: Diagnosis not present

## 2020-11-24 DIAGNOSIS — R0789 Other chest pain: Secondary | ICD-10-CM | POA: Diagnosis not present

## 2020-11-24 DIAGNOSIS — E8779 Other fluid overload: Secondary | ICD-10-CM | POA: Diagnosis not present

## 2020-11-24 DIAGNOSIS — Z131 Encounter for screening for diabetes mellitus: Secondary | ICD-10-CM

## 2020-11-24 DIAGNOSIS — E782 Mixed hyperlipidemia: Secondary | ICD-10-CM | POA: Diagnosis not present

## 2020-11-24 DIAGNOSIS — I10 Essential (primary) hypertension: Secondary | ICD-10-CM

## 2020-11-24 DIAGNOSIS — Z87891 Personal history of nicotine dependence: Secondary | ICD-10-CM

## 2020-11-24 DIAGNOSIS — Z8639 Personal history of other endocrine, nutritional and metabolic disease: Secondary | ICD-10-CM | POA: Diagnosis not present

## 2020-11-24 NOTE — Progress Notes (Addendum)
Office Visit    Patient Name: Kathryn Love Date of Encounter: 11/24/2020  PCP:  Kirk Ruths, MD   [place]  Cardiologist:  Ida Rogue, MD  Advanced Practice Provider:  No care team member to display Electrophysiologist:  None   Chief Complaint    Chief Complaint  Patient presents with  . Hospitalization Follow-up    Hospital follow up. Medications verbally reviewed with patient.     80 y.o. female with history of hypertension, hyperlipidemia, previous tobacco use (quit 1980 after smoking for 15 years), and who presents today after recent ED visit for chest pain.  Past Medical History    Past Medical History:  Diagnosis Date  . Essential (primary) hypertension 08/14/2014   Last Assessment & Plan:  Blood pressure has been controlled without significant dizzyness or associated fatigue. Taking antihypertensives as directed without difficulty.    Marland Kitchen HLD (hyperlipidemia) 08/14/2014   Last Assessment & Plan:  Has been working on a low fat diet and no myalgia's are present.    . Recurrent UTI   . Restless leg 08/14/2014   Last Assessment & Plan:  Gabapentin and Lorrin Mais are controlling symptoms reasonably well.     Past Surgical History:  Procedure Laterality Date  . ABDOMINOPLASTY  1997  . BREAST CYST EXCISION Right 1960  . COLONOSCOPY WITH PROPOFOL N/A 09/15/2015   Procedure: COLONOSCOPY WITH PROPOFOL;  Surgeon: Josefine Class, MD;  Location: [facility] ENDOSCOPY;  Service: Endoscopy;  Laterality: N/A;  . TONSILLECTOMY  1947    Allergies  No Known Allergies  History of Present Illness    Kathryn Love is a 80 y.o. female with PMH as above.  She has history of hypertension, hyperlipidemia, and prior tobacco use.    She presented to the emergency department 10/20/2020 for chest pain and was seen subsequently in the office 11/10/2020 to establish with Dr. Rockey Situ of [facility] cardiology.  At that time, she reported she was very active at baseline with  regular exercise 2 to 3 days/week.  She would go to the gym with her husband and denied any anginal symptoms during that time when on the treadmill or using weights.  Home BP typically 135/75.  She reported 3 atypical chest pain episodes.  The first occurred when she went to the emergency department.  The second occurred the day before her visit in the morning.  She described a little bit of chest discomfort.  Her husband reported she had 5 episodes.  She reportedly would have a panic attack when experiencing discomfort.  Work-up in the emergency department was unrevealing and chest pain felt to be atypical in nature.  She wondered if hormones were contributing to her symptoms.  She reported night sweats.  She was also noted to be on and off of Protonix and wondered if her symptoms could be secondary to GERD.  Given her atypical chest pain and negative emergency department work-up, recommendation was that she take her PPI on a daily basis rather than as needed.  Discussion took place regarding calcium scoring versus echo and stress testing for further work-up.  She reported a desire to have CT coronary calcium scoring performed for further risk stratification.  She was tolerating losartan and amlodipine without any medication changes made at that time.  CT scoring performed 11/14/2020 and pending final review.  At this time, it shows aortic atherosclerosis, but no actual score is available for my review.  On 11/15/2020, she presented again to the  emergency department with chest discomfort.  She reported that the chest pain started earlier in the afternoon and had been ongoing for an extended period of time, possibly a few weeks.  She had just finished her coronary CT.  She reported that her blood pressure was elevated recently with SBP 160s.  In the ED, BP 158/86 with HR 88 bpm.  Labs showed hypokalemia with potassium 3.4 and hyperglycemia with glucose 228.  EKG showed NSR with left axis deviation and nonspecific  ST/T changes.  Chest x-ray without active cardiopulmonary disease.  High-sensitivity troponin negative.  She was discharged for outpatient follow-up.  Today, 11/24/2020, she presents to clinic for follow-up of her recent trip to the emergency department.  She recalls the above scenario of chest pain that occurs with elevated BP.  She reports that, at the time of her chest pain, BP was 194/104 with associated jittery feeling.  Chest pain is further described as pressure like in character.  At the time of her presentation to the emergency department, her BP had improved.  She reports avoiding salt.  She has popcorn maybe once per week.  She does report some caffeine use with 2 mugs of coffee in the morning and otherwise none throughout the day.  She previously drank red wine every night, though she has reduced this down to the occasional 1 glass on the rare occasion.  Leading up to this episode of chest pain, she reports often feeling as if she needs to take a deep breath.  She also feels as if she needs to take a deep breath when she bends over.  She gets short of breath when going up stairs.  Weight is noted to be up 3 pounds from that of her previous 4/25 weight in the office.  She denies any orthopnea, PND, LEE, or early satiety.  No presyncope or syncope.  No signs or symptoms of bleeding.  She denies any tachypalpitations with discussion regarding her potassium of 3.4.  And her husband request the results of the coronary CT; however, unfortunately these are not yet available with reassurance that the scan will be resulted.   Home Medications   Current Outpatient Medications  Medication Instructions  . amLODipine (NORVASC) 2.5 mg, Oral, Daily  . calcium carbonate (OS-CAL - DOSED IN MG OF ELEMENTAL CALCIUM) 1250 (500 Ca) MG tablet 1 tablet, Oral, Daily with breakfast  . Cholecalciferol 50 MCG (2000 UT) TABS Oral, Daily  . gabapentin (NEURONTIN) 100 mg, Oral, Daily at bedtime  . losartan (COZAAR) 100 MG  tablet 1 tablet, Oral, Daily  . Magnesium 250 mg, Oral, Daily  . Multiple Vitamin (MULTIVITAMIN) capsule 1 capsule, Oral, Daily  . nitrofurantoin (MACRODANTIN) 50 mg, Oral, As needed  . pantoprazole (PROTONIX) 40 mg, Oral, Daily PRN  . vitamin B-12 (CYANOCOBALAMIN) 100 mcg, Oral, Daily  . zaleplon (SONATA) 5 mg, Oral, At bedtime PRN  . zaleplon (SONATA) 10 mg, Oral, At bedtime PRN  . zolpidem (AMBIEN) 10 MG tablet Take by mouth.     Review of Systems    She reports chest pain that occurs with elevated BP and is further described as pressure-like in character.  She denies weight gain, though 3 pounds weight gain noted from previous office visit.  She reports bendopnea or SOB with leaning over and shortness of breath at rest.  She reports dyspnea with minimal exertion, such as climbing the stairs.  She denies palpitations pnd, orthopnea, n, v, presyncope, syncope, LEE, or early satiety.   All  other systems reviewed and are otherwise negative except as noted above.  Physical Exam    VS:  BP 138/68 (BP Location: Left Arm, Patient Position: Sitting, Cuff Size: Normal)   Pulse (!) 59   Ht 4\' 11"  (1.499 m)   Wt 124 lb (56.2 kg)   SpO2 97%   BMI 25.04 kg/m  , BMI Body mass index is 24.44 kg/m. GEN: Well nourished, well developed, in no acute distress.  Joined by her husband. HEENT: normal. Neck: Supple, no carotid bruits or masses.  JVP approximately 10 cm. Cardiac: Bradycardic but regular.1/6 systolic RUSB murmur. No rubs or gallops. No clubbing, cyanosis, edema.  Radials/DP/PT 2+ and equal bilaterally.  Respiratory: Reduced breath sounds bilaterally. GI: Soft, nontender, nondistended, BS + x 4. MS: no deformity or atrophy. Skin: warm and dry, no rash. Neuro:  Strength and sensation are intact. Psych: Normal affect.  Accessory Clinical Findings    ECG personally reviewed by me today - SB, 59bpm, LAD, LVH, poor R wave progression V1 with possible prior septal infarct, TWI in inferior  and lateral leads V5-V6 --> changes all seen 11/10/20 - no acute changes from 11/10/20.  VITALS Reviewed today   Temp Readings from Last 3 Encounters:  11/15/20 98.6 F (37 C) (Oral)  10/20/20 98 F (36.7 C)  09/15/15 (!) 96.7 F (35.9 C) (Tympanic)   BP Readings from Last 3 Encounters:  11/24/20 138/68  11/15/20 (!) 173/95  11/10/20 (!) 148/80   Pulse Readings from Last 3 Encounters:  11/24/20 (!) 59  11/15/20 72  11/10/20 67    Wt Readings from Last 3 Encounters:  11/24/20 124 lb (56.2 kg)  11/14/20 121 lb (54.9 kg)  11/10/20 121 lb (54.9 kg)     LABS  reviewed today   Lab Results  Component Value Date   WBC 6.2 11/14/2020   HGB 13.5 11/14/2020   HCT 40.5 11/14/2020   MCV 88.2 11/14/2020   PLT 224 11/14/2020   Lab Results  Component Value Date   CREATININE 0.95 11/14/2020   BUN 23 11/14/2020   NA 138 11/14/2020   K 3.4 (L) 11/14/2020   CL 104 11/14/2020   CO2 25 11/14/2020   No results found for: ALT, AST, GGT, ALKPHOS, BILITOT No results found for: CHOL, HDL, LDLCALC, LDLDIRECT, TRIG, CHOLHDL  No results found for: HGBA1C No results found for: TSH   STUDIES/PROCEDURES reviewed today   CT score 11/14/20 IMPRESSION: 1.  Aortic Atherosclerosis (ICD10-I70.0).  Assessment & Plan   Chest pain --No current chest pain.  Reports episodes of chest pain described as pressure like in character and occurring with elevated BP as above.  Recently, she has also been experiencing symptoms of bendopnea, DOE, and SOB at rest with wt gain and bilaterally reduced breath sounds. Suspect CP occurs with elevated BP in the setting of increased volume. Volume up on exam with discussion that pt will likely benefit from a diuretic after rechecking her Cr/K. Will obtain BMET today. Discussed the importance of K+ in terms of cardiac health / electrical activity of the heart. Discussed also the importance of glucose control and influence of hyperglycemia on electrolytes and volume  status, given this can result in electrolyte and fluid shifts. Will thus also check an A1C.  As below, initiation of diuretic to be determined pending labs. Will obtain echo for further risk factor stratification as cannot completely rule out coronary insufficiency as the etiology of her CP. She does have RF for CAD including prior  history of smoking, suspected DM2, age, and HTN.  If EF reduced or WMA, further ischemic workup recommended with MPI +/- LHC. CAC scoring will certainly help to guide this workup as well once resulted. Of note, at RTC, future recommendations could include Imdur if needed. BB initiation precluded by bradycardia. Recommend ongoing RF modification with glycemic control as previously recommended and LDL control. Diet and activity also reviewed, as well as fluid and salt restrictions.   Hypokalemia, hyperglycemia --K+ low in the ED with recommendation for repeat BMET today, as well as check an A1C given hyperglycemia on labs. Discussed need for glycemic control to prevent fluid and electrolyte shifts, as well as given the overall importance of glycemic control from a cardiovascular and overall health standpoint. If hemoglobin A1c is elevated, we will forward this information to her PCP for management/treatment.  Decision regarding diuretic to be determined based on repeat labs and K values (with consideration of glucose) at that time.   Hypertension -- BP elevated with CP as in HPI.  Given labile pressures, suspect that volume status and electrolyte shifts / dietary intake is influencing these pressures. We will check a BMET and A1 C as above.  Discussed lasix versus spironolactone with decision regarding which diuretic to be determined based on repeat labs as above. Agree with losartan, given comorbid (likely) DM2 and HTN. For now, continue current amlodipine as BB precluded by bradycardia.  Educated on salt and fluid intake.  Ideally, reduce wine intake, given rebound  hypertension.  HLD --LDL 81 with Tg 57 on 3/22 labs. Pending CAC score and echo, consider initiation of statin. Would recommend continue to monitor cholesterol labs per PCP.  Prior tobacco use --Ongoing cessation recommended.   Medication changes: Addition of diuretic pending BMET Labs ordered: BMET, hemoglobin A1c Studies / Imaging ordered: Echo, still awaiting CAC score Future considerations: Pending labs, initiation of diuretic.  If A1c elevated, send to PCP.  If EF reduced or wall motion abnormalities, further ischemic work-up recommended, as well as LDL control.  If further CP, consider Imdur. Disposition: RTC after echocardiogram  *Please be aware that the above documentation was completed voice recognition software and may contain dictation errors.      Arvil Chaco, PA-C 11/24/2020

## 2020-11-24 NOTE — Patient Instructions (Addendum)
Medication Instructions:  Your physician recommends that you continue on your current medications as directed. Please refer to the Current Medication list given to you today.  *If you need a refill on your cardiac medications before your next appointment, please call your pharmacy*   Lab Work: Your physician recommends that you have lab work TODAY:  Hgb A1c, BMET  If you have labs (blood work) drawn today and your tests are completely normal, you will receive your results only by: Marland Kitchen MyChart Message (if you have MyChart) OR . A paper copy in the mail If you have any lab test that is abnormal or we need to change your treatment, we will call you to review the results.   Testing/Procedures:  Your physician has requested that you have an echocardiogram. Echocardiography is a painless test that uses sound waves to create images of your heart. It provides your doctor with information about the size and shape of your heart and how well your heart's chambers and valves are working. This procedure takes approximately one hour. There are no restrictions for this procedure.    Follow-Up: At [facility], you and your health needs are our priority.  As part of our continuing mission to provide you with exceptional heart care, we have created designated Provider Care Teams.  These Care Teams include your primary Cardiologist (physician) and Advanced Practice Providers (APPs -  Physician Assistants and Nurse Practitioners) who all work together to provide you with the care you need, when you need it.  We recommend signing up for the patient portal called "MyChart".  Sign up information is provided on this After Visit Summary.  MyChart is used to connect with patients for Virtual Visits (Telemedicine).  Patients are able to view lab/test results, encounter notes, upcoming appointments, etc.  Non-urgent messages can be sent to your provider as well.   To learn more about what you can do with MyChart, go  to [email or web address].    Your next appointment:    Follow up after echo  The format for your next appointment:   In Person  Provider:   You may see Ida Rogue, MD or one of the following Advanced Practice Providers on your designated Care Team:     Marrianne Mood, PA-C   Other Instructions  Bring your blood pressure cuff into the office at your next office visit, so that we can compare it with our cuff.

## 2020-11-25 ENCOUNTER — Ambulatory Visit (INDEPENDENT_AMBULATORY_CARE_PROVIDER_SITE_OTHER): Payer: PPO

## 2020-11-25 ENCOUNTER — Telehealth: Payer: Self-pay | Admitting: *Deleted

## 2020-11-25 DIAGNOSIS — R079 Chest pain, unspecified: Secondary | ICD-10-CM

## 2020-11-25 LAB — ECHOCARDIOGRAM COMPLETE
AR max vel: 2.45 cm2
AV Area VTI: 2.51 cm2
AV Area mean vel: 2.34 cm2
AV Mean grad: 2 mmHg
AV Peak grad: 4.2 mmHg
Ao pk vel: 1.03 m/s
Area-P 1/2: 3.3 cm2
Calc EF: 53.6 %
S' Lateral: 3.2 cm
Single Plane A2C EF: 54.1 %
Single Plane A4C EF: 55.5 %

## 2020-11-25 NOTE — Telephone Encounter (Signed)
Patient returning call for results 

## 2020-11-25 NOTE — Telephone Encounter (Signed)
-----   Message from Arvil Chaco, PA-C sent at 11/25/2020 10:51 AM EDT ----- Elevated A1C. Recommend glucose control to prevent electrolyte and fluid shifts. Please let the pt know I will fax the results to her PCP.  BMET still not yet resulted.  Will likely start a low dose diuretic after BMET resulted, as discussed with pt during clinic.   In case you get follow-up questions:  --We discussed that fluid can cause SOB at rest and needing to take that deep breath she described when bending over, as well as her SOB on the stairs. It can also cause CP and labile BP. Will still get an echo for further risk stratification. CAC score should be resulted soon as discussed in clinic.

## 2020-11-25 NOTE — Telephone Encounter (Signed)
Attempted to call pt to review results and provider's recc.  No answer. Lmtcb.  

## 2020-11-26 DIAGNOSIS — H40003 Preglaucoma, unspecified, bilateral: Secondary | ICD-10-CM | POA: Diagnosis not present

## 2020-11-26 LAB — BASIC METABOLIC PANEL
BUN/Creatinine Ratio: 24 (ref 12–28)
BUN: 22 mg/dL (ref 8–27)
CO2: 19 mmol/L — ABNORMAL LOW (ref 20–29)
Calcium: 9.5 mg/dL (ref 8.7–10.3)
Chloride: 104 mmol/L (ref 96–106)
Creatinine, Ser: 0.92 mg/dL (ref 0.57–1.00)
Glucose: 95 mg/dL (ref 65–99)
Potassium: 4.2 mmol/L (ref 3.5–5.2)
Sodium: 139 mmol/L (ref 134–144)
eGFR: 63 mL/min/{1.73_m2} (ref >59)

## 2020-11-26 LAB — HEMOGLOBIN A1C
Est. average glucose Bld gHb Est-mCnc: 120 mg/dL
Hgb A1c MFr Bld: 5.8 % — ABNORMAL HIGH (ref 4.8–5.6)

## 2020-11-26 NOTE — Telephone Encounter (Signed)
Patient returning call about results.  

## 2020-11-26 NOTE — Telephone Encounter (Signed)
Spoke with patient and gave her the result note as seen below. Patient was grateful for the phone call.

## 2020-12-01 ENCOUNTER — Telehealth: Payer: Self-pay | Admitting: *Deleted

## 2020-12-01 NOTE — Telephone Encounter (Signed)
-----   Message from Arvil Chaco, PA-C sent at 11/28/2020  1:56 PM EDT ----- Kermit Balo news! Echo showed  --Pump function normal. EF 55-60%, which means nl heart squeeze. --Walls of heart are moving normally. --Slightly stiff to relax, which happens with elevated BP. We will be sure to control this moving forward. --Mitral valve is trivially leaky and top left chamber mildly dilated, which is not concerning, and which we can continue to monitor.   Overall, a very reassuring echo.  If her breathing is still not doing well, we can discuss starting her on a fluid pill at her upcoming visit. We can also review these studies at that time.

## 2020-12-01 NOTE — Telephone Encounter (Signed)
Patient calling to discuss recent testing results  ° °Please call  ° °

## 2020-12-01 NOTE — Telephone Encounter (Signed)
-----   Message from Arvil Chaco, PA-C sent at 11/27/2020  2:06 PM EDT ----- Kermit Balo news! Renal function stable. Potassium improved and back to normal range, which is reassuring.

## 2020-12-01 NOTE — Telephone Encounter (Signed)
Left voicemail message that I was calling with her lab and echo results with instructions to call back.

## 2020-12-01 NOTE — Telephone Encounter (Signed)
Spoke to pt, notified of both lab and echo results below.  Faxed electronically via Epic to pt's PCP Dr. Ouida Sills per pt's request.  Pt appreciative and verbalized understanding. No further questions.

## 2020-12-03 ENCOUNTER — Telehealth: Payer: Self-pay

## 2020-12-03 NOTE — Telephone Encounter (Signed)
Attempted to reach out to pt via phone, unable to reach pt or LDM. LMTCB for CT calcium score results

## 2020-12-03 NOTE — Telephone Encounter (Signed)
Patient returning call.

## 2020-12-17 ENCOUNTER — Ambulatory Visit: Payer: PPO | Admitting: Physician Assistant

## 2020-12-23 ENCOUNTER — Telehealth: Payer: Self-pay

## 2020-12-23 NOTE — Telephone Encounter (Signed)
Patient returning call.  She is aware results were mailed and states she doesn't need to talk with anyone.  She agrees to review and call back if she has questions.

## 2020-12-23 NOTE — Telephone Encounter (Signed)
Attempted to reach out to pt via phone, still unable to reach pt, no DPR on file, cannot LDM on VM. LMTCB for CT calcium score results. Also, advised will send letter in mail of results.  Sent letter in mail of results

## 2021-03-30 DIAGNOSIS — N183 Chronic kidney disease, stage 3 unspecified: Secondary | ICD-10-CM | POA: Diagnosis not present

## 2021-03-30 DIAGNOSIS — E7800 Pure hypercholesterolemia, unspecified: Secondary | ICD-10-CM | POA: Diagnosis not present

## 2021-03-30 DIAGNOSIS — I129 Hypertensive chronic kidney disease with stage 1 through stage 4 chronic kidney disease, or unspecified chronic kidney disease: Secondary | ICD-10-CM | POA: Diagnosis not present

## 2021-04-06 DIAGNOSIS — E7800 Pure hypercholesterolemia, unspecified: Secondary | ICD-10-CM | POA: Diagnosis not present

## 2021-04-06 DIAGNOSIS — I129 Hypertensive chronic kidney disease with stage 1 through stage 4 chronic kidney disease, or unspecified chronic kidney disease: Secondary | ICD-10-CM | POA: Diagnosis not present

## 2021-04-06 DIAGNOSIS — Z0000 Encounter for general adult medical examination without abnormal findings: Secondary | ICD-10-CM | POA: Diagnosis not present

## 2021-04-06 DIAGNOSIS — N183 Chronic kidney disease, stage 3 unspecified: Secondary | ICD-10-CM | POA: Diagnosis not present

## 2021-04-24 DIAGNOSIS — X32XXXA Exposure to sunlight, initial encounter: Secondary | ICD-10-CM | POA: Diagnosis not present

## 2021-04-24 DIAGNOSIS — D2272 Melanocytic nevi of left lower limb, including hip: Secondary | ICD-10-CM | POA: Diagnosis not present

## 2021-04-24 DIAGNOSIS — Z85828 Personal history of other malignant neoplasm of skin: Secondary | ICD-10-CM | POA: Diagnosis not present

## 2021-04-24 DIAGNOSIS — L570 Actinic keratosis: Secondary | ICD-10-CM | POA: Diagnosis not present

## 2021-04-24 DIAGNOSIS — D2262 Melanocytic nevi of left upper limb, including shoulder: Secondary | ICD-10-CM | POA: Diagnosis not present

## 2021-04-24 DIAGNOSIS — D2261 Melanocytic nevi of right upper limb, including shoulder: Secondary | ICD-10-CM | POA: Diagnosis not present

## 2021-07-06 DIAGNOSIS — Z20822 Contact with and (suspected) exposure to covid-19: Secondary | ICD-10-CM | POA: Diagnosis not present

## 2021-07-06 DIAGNOSIS — Z03818 Encounter for observation for suspected exposure to other biological agents ruled out: Secondary | ICD-10-CM | POA: Diagnosis not present

## 2021-07-14 DIAGNOSIS — Z20822 Contact with and (suspected) exposure to covid-19: Secondary | ICD-10-CM | POA: Diagnosis not present

## 2021-07-14 DIAGNOSIS — Z03818 Encounter for observation for suspected exposure to other biological agents ruled out: Secondary | ICD-10-CM | POA: Diagnosis not present

## 2021-09-23 ENCOUNTER — Ambulatory Visit: Payer: PPO | Admitting: Podiatry

## 2021-09-23 ENCOUNTER — Encounter: Payer: Self-pay | Admitting: Podiatry

## 2021-09-23 ENCOUNTER — Other Ambulatory Visit: Payer: Self-pay

## 2021-09-23 DIAGNOSIS — M7751 Other enthesopathy of right foot: Secondary | ICD-10-CM

## 2021-09-23 DIAGNOSIS — D2372 Other benign neoplasm of skin of left lower limb, including hip: Secondary | ICD-10-CM | POA: Diagnosis not present

## 2021-09-23 DIAGNOSIS — M7752 Other enthesopathy of left foot: Secondary | ICD-10-CM

## 2021-09-23 MED ORDER — DEXAMETHASONE SODIUM PHOSPHATE 120 MG/30ML IJ SOLN
4.0000 mg | Freq: Once | INTRAMUSCULAR | Status: AC
  Administered 2021-09-23: 4 mg via INTRA_ARTICULAR

## 2021-09-24 NOTE — Progress Notes (Signed)
?Subjective:  ?Patient ID: Kathryn Love, female    DOB: 1940-08-09,  MRN: [id number] ?HPI ?Chief Complaint  ?Patient presents with  ? Foot Pain  ?  Sub 5th met base bilateral (L>R) - tender, callused areas x couple months  ? New Patient (Initial Visit)  ? ? ?81 y.o. female presents with the above complaint.  ? ?ROS: Denies fever chills nausea vomiting muscle aches pains calf pain back pain chest pain shortness of breath. ? ?Past Medical History:  ?Diagnosis Date  ? Essential (primary) hypertension 08/14/2014  ? Last Assessment & Plan:  Blood pressure has been controlled without significant dizzyness or associated fatigue. Taking antihypertensives as directed without difficulty.    ? HLD (hyperlipidemia) 08/14/2014  ? Last Assessment & Plan:  Has been working on a low fat diet and no myalgia's are present.    ? Recurrent UTI   ? Restless leg 08/14/2014  ? Last Assessment & Plan:  Gabapentin and ambien are controlling symptoms reasonably well.    ? ?Past Surgical History:  ?Procedure Laterality Date  ? ABDOMINOPLASTY  1997  ? BREAST CYST EXCISION Right 1960  ? COLONOSCOPY WITH PROPOFOL N/A 09/15/2015  ? Procedure: COLONOSCOPY WITH PROPOFOL;  Surgeon: Josefine Class, MD;  Location: [facility] ENDOSCOPY;  Service: Endoscopy;  Laterality: N/A;  ? TONSILLECTOMY  1947  ? ? ?Current Outpatient Medications:  ?  amLODipine (NORVASC) 5 MG tablet, Take 5 mg by mouth daily., Disp: , Rfl:  ?  calcium carbonate (OS-CAL - DOSED IN MG OF ELEMENTAL CALCIUM) 1250 (500 Ca) MG tablet, Take 1 tablet by mouth daily with breakfast., Disp: , Rfl:  ?  Cholecalciferol 50 MCG (2000 UT) TABS, Take by mouth daily., Disp: , Rfl:  ?  gabapentin (NEURONTIN) 100 MG capsule, Take 100 mg by mouth at bedtime., Disp: , Rfl:  ?  losartan (COZAAR) 100 MG tablet, Take 1 tablet by mouth daily., Disp: , Rfl:  ?  Magnesium 250 MG TABS, Take 250 mg by mouth daily., Disp: , Rfl:  ?  Multiple Vitamin (MULTIVITAMIN) capsule, Take 1 capsule by mouth daily., Disp: , Rfl:   ?  nitrofurantoin (MACRODANTIN) 50 MG capsule, Take 50 mg by mouth as needed., Disp: , Rfl:  ?  pantoprazole (PROTONIX) 40 MG tablet, Take 40 mg by mouth daily as needed., Disp: , Rfl:  ?  vitamin B-12 (CYANOCOBALAMIN) 100 MCG tablet, Take 100 mcg by mouth daily., Disp: , Rfl:  ?  zaleplon (SONATA) 5 MG capsule, Take 5 mg by mouth at bedtime as needed for sleep., Disp: , Rfl:  ? ?No Known Allergies ?Review of Systems ?Objective:  ?There were no vitals filed for this visit. ? ?General: Well developed, nourished, in no acute distress, alert and oriented x3  ? ?Dermatological: Skin is warm, dry and supple bilateral. Nails x 10 are well maintained; remaining integument appears unremarkable at this time. There are no open sores, no preulcerative lesions, no rash or signs of infection present.  She is not painful benign skin lesion to the plantar aspect of the fifth metatarsal of the left foot.  This is beneath the fifth metatarsal base where an area of fluctuance is also noted.  Contralateral foot does not demonstrate any skin lesion though it does demonstrate fluctuance. ? ?Vascular: Dorsalis Pedis artery and Posterior Tibial artery pedal pulses are 2/4 bilateral with immedate capillary fill time. Pedal hair growth present. No varicosities and no lower extremity edema present bilateral.  ? ?Neruologic: Grossly intact via light touch bilateral.  Vibratory intact via tuning fork bilateral. Protective threshold with Semmes Wienstein monofilament intact to all pedal sites bilateral. Patellar and Achilles deep tendon reflexes 2+ bilateral. No Babinski or clonus noted bilateral.  ? ?Musculoskeletal: No gross boney pedal deformities bilateral. No pain, crepitus, or limitation noted with foot and ankle range of motion bilateral. Muscular strength 5/5 in all groups tested bilateral.  Fifth metatarsal base is prominent bilateral with fat pad atrophy.  She does have fluctuance beneath the fifth metatarsal base left greater than  right. ? ?Gait: Unassisted, Nonantalgic.  ? ? ?Radiographs: ? ?None taken ? ?Assessment & Plan:  ? ?Assessment: Benign skin lesions of fifth metatarsal base left foot.  Bursitis of fifth metatarsal base bilateral secondary to fat pad atrophy. ? ?Plan: Discussed appropriate shoe gear stretching exercise ice therapy sugar modifications I debrided the reactive benign skin lesion today.  Also injected the bursa today with 2 mg of dexamethasone and local anesthetic in the bilateral bursa after sterile Betadine skin prep.  Tolerated procedure well without complications we will follow-up with me on an as-needed basis ? ? ? ? ?Carely Nappier T. [place], DPM ?

## 2021-09-30 ENCOUNTER — Encounter: Payer: Self-pay | Admitting: Internal Medicine

## 2021-09-30 ENCOUNTER — Other Ambulatory Visit: Payer: Self-pay

## 2021-09-30 ENCOUNTER — Ambulatory Visit (INDEPENDENT_AMBULATORY_CARE_PROVIDER_SITE_OTHER): Payer: PPO | Admitting: Internal Medicine

## 2021-09-30 DIAGNOSIS — R21 Rash and other nonspecific skin eruption: Secondary | ICD-10-CM

## 2021-09-30 DIAGNOSIS — G479 Sleep disorder, unspecified: Secondary | ICD-10-CM | POA: Insufficient documentation

## 2021-09-30 DIAGNOSIS — G2581 Restless legs syndrome: Secondary | ICD-10-CM

## 2021-09-30 DIAGNOSIS — I10 Essential (primary) hypertension: Secondary | ICD-10-CM | POA: Diagnosis not present

## 2021-09-30 DIAGNOSIS — I872 Venous insufficiency (chronic) (peripheral): Secondary | ICD-10-CM | POA: Diagnosis not present

## 2021-09-30 MED ORDER — GABAPENTIN 300 MG PO CAPS: 300.0000 mg | ORAL_CAPSULE | Freq: Every day | ORAL | 11 refills | Status: DC

## 2021-09-30 NOTE — Progress Notes (Signed)
? ?Subjective:  ? ? Patient ID: Kathryn Love, female    DOB: Apr 17, 1941, 81 y.o.   MRN: [id number] ? ?HPI ?Here with husband to establish care ? ?Has swelling in left leg and knee pain there ?Has tried compression hose daily and that helps ?Has seen red spots by ankle at times as well ? ?Has some rash by lips and numbness ?Wondered if it was a reaction to something (like lipstick) ?Had COVID in January--thought it might be related ? ?Ongoing Rx for HTN ?Losartan and amlodipine ? ?Chronic sleep problems ?Uses the zaleplon nightly--might take 1-1.5 hours to initiate ?Gets up anywhere from 6:30-8:30AM ?Tries to initiate sleep 10-11PM ?Nocturia x 2-3--usually able to get back to sleep ? ?Has RLS ?Takes gabapentin '200mg'$  at bedtime ? ?Has had some chest pressure when on treadmill ?Has to take big breath and it resolves ?Did see cardiology about that ?Coronary calcium was 0, echo okay ? ?No heartburn on the protonix ?No dysphagia ? ?Current Outpatient Medications on File Prior to Visit  ?Medication Sig Dispense Refill  ? amLODipine (NORVASC) 5 MG tablet Take 5 mg by mouth daily.    ? calcium carbonate (OS-CAL - DOSED IN MG OF ELEMENTAL CALCIUM) 1250 (500 Ca) MG tablet Take 1 tablet by mouth daily with breakfast.    ? Cholecalciferol 50 MCG (2000 UT) TABS Take by mouth daily.    ? gabapentin (NEURONTIN) 100 MG capsule Take 100 mg by mouth at bedtime.    ? losartan (COZAAR) 100 MG tablet Take 1 tablet by mouth daily.    ? Magnesium 250 MG TABS Take 250 mg by mouth daily.    ? Multiple Vitamin (MULTIVITAMIN) capsule Take 1 capsule by mouth daily.    ? nitrofurantoin (MACRODANTIN) 50 MG capsule Take 50 mg by mouth as needed.    ? vitamin B-12 (CYANOCOBALAMIN) 100 MCG tablet Take 100 mcg by mouth daily.    ? zaleplon (SONATA) 5 MG capsule Take 5 mg by mouth at bedtime.    ? pantoprazole (PROTONIX) 40 MG tablet Take 40 mg by mouth daily.    ? ?No current facility-administered medications on file prior to visit.  ? ? ?No Known  Allergies ? ?Past Medical History:  ?Diagnosis Date  ? Essential (primary) hypertension 08/14/2014  ? Last Assessment & Plan:  Blood pressure has been controlled without significant dizzyness or associated fatigue. Taking antihypertensives as directed without difficulty.    ? HLD (hyperlipidemia) 08/14/2014  ? Last Assessment & Plan:  Has been working on a low fat diet and no myalgia's are present.    ? Recurrent UTI   ? Restless leg 08/14/2014  ? Last Assessment & Plan:  Gabapentin and ambien are controlling symptoms reasonably well.    ? ? ?Past Surgical History:  ?Procedure Laterality Date  ? ABDOMINOPLASTY  1997  ? BREAST CYST EXCISION Right 1960  ? COLONOSCOPY WITH PROPOFOL N/A 09/15/2015  ? Procedure: COLONOSCOPY WITH PROPOFOL;  Surgeon: Josefine Class, MD;  Location: [facility] ENDOSCOPY;  Service: Endoscopy;  Laterality: N/A;  ? TONSILLECTOMY  1947  ? ? ?Family History  ?Problem Relation Age of Onset  ? Bladder Cancer Mother   ? Breast cancer Maternal Grandmother 60  ?     Unsure  ? Prostate cancer Neg Hx   ? Kidney cancer Neg Hx   ? ? ?Social History  ? ?Socioeconomic History  ? Marital status: Married  ?  Spouse name: Not on file  ? Number of children: Not on  file  ? Years of education: Not on file  ? Highest education level: Not on file  ?Occupational History  ? Occupation: Real estate  ?  Comment: retired  ? 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  Comment: retired  ?Tobacco Use  ? Smoking status: Former  ?  Passive exposure: Never  ? Smokeless tobacco: Never  ?Substance and Sexual Activity  ? Alcohol use: Yes  ?  Alcohol/week: 0.0 standard drinks  ? Drug use: No  ? Sexual activity: Not on file  ?Other Topics Concern  ? Not on file  ?Social History Narrative  ? Lost 1 son  ? 1 son living  ?   ? Has living will  ? Husband then son are [facility] POA  ? Would accept resuscitation  ?  No prolonged tube feeds  ?   ? Donated body to [facility]  ? ?Social Determinants of Health  ? ?Financial Resource Strain: Not on file   ?Food Insecurity: Not on file  ?Transportation Needs: Not on file  ?Physical Activity: Not on file  ?Stress: Not on file  ?Social Connections: Not on file  ?Intimate Partner Violence: Not on file  ? ?Review of Systems  ?Constitutional:  Negative for fatigue and unexpected weight change.  ?     Regular exercise  ?HENT:  Negative for dental problem and hearing loss.   ?Eyes:  Negative for visual disturbance.  ?Respiratory:  Negative for cough, chest tightness and shortness of breath.   ?Cardiovascular:  Positive for chest pain. Negative for palpitations and leg swelling.  ?Gastrointestinal:  Negative for blood in stool and constipation.  ?Genitourinary:  Negative for dysuria and hematuria.  ?     Occasionally stress incontinence  ?Musculoskeletal:  Positive for arthralgias. Negative for back pain and joint swelling.  ?     Some right hip or left knee pain ?No regular meds  ?Allergic/Immunologic: Negative for environmental allergies and immunocompromised state.  ?Neurological:  Negative for dizziness, syncope and light-headedness.  ?Psychiatric/Behavioral:  Negative for dysphoric mood and sleep disturbance. The patient is not nervous/anxious.   ? ?   ?Objective:  ? Physical Exam ?Constitutional:   ?   Appearance: Normal appearance.  ?Cardiovascular:  ?   Rate and Rhythm: Normal rate and regular rhythm.  ?   Pulses: Normal pulses.  ?   Heart sounds: No murmur heard. ?  No gallop.  ?Pulmonary:  ?   Effort: Pulmonary effort is normal.  ?   Breath sounds: Normal breath sounds. No wheezing or rales.  ?Abdominal:  ?   Palpations: Abdomen is soft.  ?   Tenderness: There is no abdominal tenderness.  ?Musculoskeletal:  ?   Cervical back: Neck supple.  ?   Right lower leg: No edema.  ?   Left lower leg: No edema.  ?Lymphadenopathy:  ?   Cervical: No cervical adenopathy.  ?Skin: ?   Comments: Tiny red macules under lower lip ?One lesion on inside of lip as well  ?Neurological:  ?   Mental Status: She is alert.  ?Psychiatric:      ?   Mood and Affect: Mood normal.     ?   Behavior: Behavior normal.  ?  ? ? ? ? ?   ?Assessment & Plan:  ? ?

## 2021-09-30 NOTE — Assessment & Plan Note (Signed)
Discussed sleep contraction ?Needs stable wake time ?Try increased gabapentin and hopefully can stop sonata ?

## 2021-09-30 NOTE — Assessment & Plan Note (Signed)
BP Readings from Last 3 Encounters:  ?09/30/21 112/70  ?11/24/20 138/68  ?11/15/20 (!) 173/95  ? ?Good control on losartan '100mg'$  and amlodipine '5mg'$  ?Could consider reducing amlodipine if edema worsens ?

## 2021-09-30 NOTE — Assessment & Plan Note (Signed)
Does okay with gabapentin ?Will increase to 300-600 at bedtime in hopes of helping sleep ?

## 2021-09-30 NOTE — Assessment & Plan Note (Signed)
Non specific rash under lips---may be reactive ?Just started---recommended just monitoring ?Will consider triamcinolone if does not clear ?

## 2021-09-30 NOTE — Assessment & Plan Note (Signed)
Mild  ?Does well with compression hose ?

## 2021-11-10 ENCOUNTER — Other Ambulatory Visit: Payer: Self-pay | Admitting: Internal Medicine

## 2021-12-02 DIAGNOSIS — H40003 Preglaucoma, unspecified, bilateral: Secondary | ICD-10-CM | POA: Diagnosis not present

## 2021-12-21 ENCOUNTER — Ambulatory Visit: Payer: PPO | Admitting: Podiatry

## 2021-12-21 ENCOUNTER — Encounter: Payer: Self-pay | Admitting: Podiatry

## 2021-12-21 DIAGNOSIS — M7752 Other enthesopathy of left foot: Secondary | ICD-10-CM

## 2021-12-21 DIAGNOSIS — M7751 Other enthesopathy of right foot: Secondary | ICD-10-CM

## 2021-12-21 DIAGNOSIS — D2372 Other benign neoplasm of skin of left lower limb, including hip: Secondary | ICD-10-CM

## 2021-12-21 MED ORDER — DEXAMETHASONE SODIUM PHOSPHATE 120 MG/30ML IJ SOLN
2.0000 mg | Freq: Once | INTRAMUSCULAR | Status: AC
  Administered 2021-12-21: 2 mg via INTRA_ARTICULAR

## 2021-12-21 NOTE — Progress Notes (Signed)
She presents today for a chief complaint of painful foot bilaterally but this 1 right here is giving me the worst pain as she points to a callus on the plantar aspect of the fifth metatarsal left foot.  Objective: Vital signs are stable alert and oriented x3.  She has a palpable bursa subfifth metatarsal base left foot.  She also has an overlying benign skin lesion with a nucleus.  Assessment: Bursitis with a nucleated benign skin lesion left.  Plan: Enucleated but benign skin lesion today placed Salinocaine under occlusion to be washed off in 3 days.  I also injected the bursa today with dexamethasone and local anesthetic follow-up with her as needed

## 2022-03-01 ENCOUNTER — Ambulatory Visit: Payer: PPO | Admitting: Podiatry

## 2022-03-01 DIAGNOSIS — D2372 Other benign neoplasm of skin of left lower limb, including hip: Secondary | ICD-10-CM | POA: Diagnosis not present

## 2022-03-01 DIAGNOSIS — M7752 Other enthesopathy of left foot: Secondary | ICD-10-CM

## 2022-03-01 NOTE — Progress Notes (Signed)
She presents today for follow-up of her benign neoplasm to the plantar aspect Sub fifth metatarsal base left foot.  States that is doing quite well have had no problems other than some mild swelling in this left leg for which I usually wear compression hose.  Objective: Vital signs stable she alert oriented x3 very mild swelling in the left ankle nontender.  She has a nontender benign skin lesion Sub fifth met base left.  There is a small nucleus noted in this lesion.  There is no erythema or bursitis type sensation on palpation.  Assessment: Benign skin lesion.  Swelling of the left ankle.  Plan: Discussed etiology pathology and surgical therapies at this point debrided the benign skin lesion for her.  Discussed that she should continue to wear her compression hose most likely venous insufficiency.

## 2022-03-08 DIAGNOSIS — C44629 Squamous cell carcinoma of skin of left upper limb, including shoulder: Secondary | ICD-10-CM | POA: Diagnosis not present

## 2022-03-08 DIAGNOSIS — L570 Actinic keratosis: Secondary | ICD-10-CM | POA: Diagnosis not present

## 2022-03-08 DIAGNOSIS — Z85828 Personal history of other malignant neoplasm of skin: Secondary | ICD-10-CM | POA: Diagnosis not present

## 2022-03-08 DIAGNOSIS — D225 Melanocytic nevi of trunk: Secondary | ICD-10-CM | POA: Diagnosis not present

## 2022-03-08 DIAGNOSIS — Z08 Encounter for follow-up examination after completed treatment for malignant neoplasm: Secondary | ICD-10-CM | POA: Diagnosis not present

## 2022-03-08 DIAGNOSIS — D0439 Carcinoma in situ of skin of other parts of face: Secondary | ICD-10-CM | POA: Diagnosis not present

## 2022-03-08 DIAGNOSIS — D485 Neoplasm of uncertain behavior of skin: Secondary | ICD-10-CM | POA: Diagnosis not present

## 2022-03-08 DIAGNOSIS — X32XXXA Exposure to sunlight, initial encounter: Secondary | ICD-10-CM | POA: Diagnosis not present

## 2022-04-06 DIAGNOSIS — C44629 Squamous cell carcinoma of skin of left upper limb, including shoulder: Secondary | ICD-10-CM | POA: Diagnosis not present

## 2022-04-06 DIAGNOSIS — D2362 Other benign neoplasm of skin of left upper limb, including shoulder: Secondary | ICD-10-CM | POA: Diagnosis not present

## 2022-04-15 ENCOUNTER — Other Ambulatory Visit: Payer: Self-pay | Admitting: Internal Medicine

## 2022-05-04 ENCOUNTER — Other Ambulatory Visit: Payer: PPO

## 2022-05-11 ENCOUNTER — Ambulatory Visit (INDEPENDENT_AMBULATORY_CARE_PROVIDER_SITE_OTHER): Payer: PPO | Admitting: Internal Medicine

## 2022-05-11 ENCOUNTER — Encounter: Payer: Self-pay | Admitting: Internal Medicine

## 2022-05-11 VITALS — BP 132/72 | HR 61 | Temp 97.4°F | Ht 58.75 in | Wt 122.0 lb

## 2022-05-11 DIAGNOSIS — M810 Age-related osteoporosis without current pathological fracture: Secondary | ICD-10-CM | POA: Diagnosis not present

## 2022-05-11 DIAGNOSIS — G2581 Restless legs syndrome: Secondary | ICD-10-CM

## 2022-05-11 DIAGNOSIS — Z0000 Encounter for general adult medical examination without abnormal findings: Secondary | ICD-10-CM | POA: Diagnosis not present

## 2022-05-11 DIAGNOSIS — I10 Essential (primary) hypertension: Secondary | ICD-10-CM | POA: Diagnosis not present

## 2022-05-11 DIAGNOSIS — I872 Venous insufficiency (chronic) (peripheral): Secondary | ICD-10-CM

## 2022-05-11 LAB — COMPREHENSIVE METABOLIC PANEL
ALT: 12 U/L (ref 0–35)
AST: 24 U/L (ref 0–37)
Albumin: 4.4 g/dL (ref 3.5–5.2)
Alkaline Phosphatase: 86 U/L (ref 39–117)
BUN: 22 mg/dL (ref 6–23)
CO2: 29 mEq/L (ref 19–32)
Calcium: 10.2 mg/dL (ref 8.4–10.5)
Chloride: 103 mEq/L (ref 96–112)
Creatinine, Ser: 0.93 mg/dL (ref 0.40–1.20)
GFR: 57.27 mL/min — ABNORMAL LOW (ref >60.00)
Glucose, Bld: 93 mg/dL (ref 70–99)
Potassium: 4.3 mEq/L (ref 3.5–5.1)
Sodium: 138 mEq/L (ref 135–145)
Total Bilirubin: 0.7 mg/dL (ref 0.2–1.2)
Total Protein: 7 g/dL (ref 6.0–8.3)

## 2022-05-11 LAB — CBC
HCT: 40.4 % (ref 36.0–46.0)
Hemoglobin: 13.4 g/dL (ref 12.0–15.0)
MCHC: 33 g/dL (ref 30.0–36.0)
MCV: 88.4 fl (ref 78.0–100.0)
Platelets: 232 10*3/uL (ref 150.0–400.0)
RBC: 4.57 Mil/uL (ref 3.87–5.11)
RDW: 13.9 % (ref 11.5–15.5)
WBC: 5.8 10*3/uL (ref 4.0–10.5)

## 2022-05-11 LAB — LIPID PANEL
Cholesterol: 171 mg/dL (ref 0–200)
HDL: 71.3 mg/dL (ref >39.00)
LDL Cholesterol: 89 mg/dL (ref 0–99)
NonHDL: 99.5
Total CHOL/HDL Ratio: 2
Triglycerides: 55 mg/dL (ref 0.0–149.0)
VLDL: 11 mg/dL (ref 0.0–40.0)

## 2022-05-11 MED ORDER — ZALEPLON 5 MG PO CAPS: 5.0000 mg | ORAL_CAPSULE | Freq: Every day | ORAL | 0 refills | Status: DC

## 2022-05-11 NOTE — Progress Notes (Signed)
Subjective:    Patient ID: Kathryn Love, female    DOB: 1940-10-12, 81 y.o.   MRN: [id number]  HPI Here with husband for Medicare wellness visit and follow up of chronic health conditions Reviewed advanced directives Reviewed other doctors---Dr Kemp--dentist, Dr Barbee Cough, Dr Hyatt--podiatrist, Ms Charlton Amor No hospitalizations or surgery this year (other than skin cancer) Vision is good Hearing is fine Enjoys red wine with dinner No tobacco products Tripped once ---no injury No depression or anhedonia Independent with instrumental ADLs No sig memory issues  Works out 3 days a week--treadmill and Warden/ranger also Does notice a "hitch" in back ---posterior hips (sacroiliac area). Has to slow down and "gear up" after sitting for a while Loosens up after a few steps  Discussed the osteoporosis No history of fractures--other than pelvic fractures about 10 years ago Is on vitamin D  Taste buds are not right Brushes her tongue Wonders if any other actions can help Is on multivitamin Did have COVID at least twice  Has nitrofurantoin for bladder symptoms when they come on  No chest pain  Only gets SOB if she bends over and gets up quick No dizziness or syncope Some edema--wears compression hose every other day No palpitations  Still takes gabapentin Just the '300mg'$  though  Uses pantoprazole every other day Rare heartburn--like after pizza (uses pepcid complete) No dysphagia  Current Outpatient Medications on File Prior to Visit  Medication Sig Dispense Refill   amLODipine (NORVASC) 5 MG tablet TAKE 1 TABLET BY MOUTH ONCE DAILY. 90 tablet 3   calcium carbonate (OS-CAL - DOSED IN MG OF ELEMENTAL CALCIUM) 1250 (500 Ca) MG tablet Take 1 tablet by mouth daily with breakfast.     Cholecalciferol 50 MCG (2000 UT) TABS Take by mouth daily.     Cyanocobalamin (B-12) 5000 MCG CAPS Take by mouth.     gabapentin (NEURONTIN) 300 MG capsule Take 1-2 capsules (300-600  mg total) by mouth at bedtime. For restless legs 60 capsule 11   losartan (COZAAR) 100 MG tablet TAKE 1 TABLET BY MOUTH DAILY 90 tablet 3   Magnesium 250 MG TABS Take 250 mg by mouth daily.     Multiple Vitamin (MULTIVITAMIN) capsule Take 1 capsule by mouth daily.     nitrofurantoin (MACRODANTIN) 50 MG capsule Take 50 mg by mouth as needed.     zaleplon (SONATA) 5 MG capsule Take 5 mg by mouth at bedtime.     pantoprazole (PROTONIX) 40 MG tablet Take 40 mg by mouth every other day.     No current facility-administered medications on file prior to visit.    No Known Allergies  Past Medical History:  Diagnosis Date   Essential (primary) hypertension 08/14/2014   Last Assessment & Plan:  Blood pressure has been controlled without significant dizzyness or associated fatigue. Taking antihypertensives as directed without difficulty.     HLD (hyperlipidemia) 08/14/2014   Last Assessment & Plan:  Has been working on a low fat diet and no myalgia's are present.     Recurrent UTI    Restless leg 08/14/2014   Last Assessment & Plan:  Gabapentin and Lorrin Mais are controlling symptoms reasonably well.      Past Surgical History:  Procedure Laterality Date   ABDOMINOPLASTY  1997   BREAST CYST EXCISION Right 1960   COLONOSCOPY WITH PROPOFOL N/A 09/15/2015   Procedure: COLONOSCOPY WITH PROPOFOL;  Surgeon: Josefine Class, MD;  Location: [facility] ENDOSCOPY;  Service: Endoscopy;  Laterality: N/A;  TONSILLECTOMY  1947    Family History  Problem Relation Age of Onset   Bladder Cancer Mother    Breast cancer Maternal Grandmother 71       Unsure   Prostate cancer Neg Hx    Kidney cancer Neg Hx     Social History   Socioeconomic History   Marital status: Married    Spouse name: Not on file   Number of children: Not on file   Years of education: Not on file   Highest education level: Not on file  Occupational History   Occupation: Real estate    Comment: retired   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   Social Determinants of Health   Financial Resource Strain: Not on file  Food Insecurity: Not on file  Transportation Needs: Not on file  Physical Activity: Not on file  Stress: Not on file  Social Connections: Not on file  Intimate Partner Violence: Not on file   Review of Systems Appetite is good Weight stable Chronic sleep problems---uses the sonata prn  Wears seat belt Teeth okay---keeps up with dentist Bowels are fine---blood on paper rarely (hemorrhoid) No other joint or back problems. Some limitation in left shoulder motion No suspicious skin lesions today    Objective:   Physical Exam Constitutional:      Appearance: Normal appearance.  HENT:     Mouth/Throat:     Comments: No lesions Eyes:     Conjunctiva/sclera: Conjunctivae normal.     Pupils: Pupils are equal, round, and reactive to light.  Cardiovascular:     Rate and Rhythm: Normal rate and regular rhythm.     Pulses: Normal pulses.     Heart sounds: No murmur heard.    No gallop.  Pulmonary:     Effort: Pulmonary effort is normal.     Breath sounds: Normal breath sounds. No wheezing or rales.  Abdominal:     Palpations: Abdomen is soft.     Tenderness: There is no abdominal tenderness.  Musculoskeletal:     Cervical back: Neck supple.     Right lower leg: No edema.     Left lower leg: No edema.     Comments: Normal ROM in hips  Lymphadenopathy:     Cervical: No cervical adenopathy.  Skin:     Findings: No lesion or rash.  Neurological:     General: No focal deficit present.     Mental Status: She is alert and oriented to person, place, and time.     Comments: Mini---cog normal  Psychiatric:        Mood and Affect: Mood normal.        Behavior: Behavior normal.            Assessment & Plan:

## 2022-05-11 NOTE — Assessment & Plan Note (Signed)
BP Readings from Last 3 Encounters:  05/11/22 132/72  09/30/21 112/70  11/24/20 138/68   Good control on amlodipine 5, losartan 100 Will check labs

## 2022-05-11 NOTE — Progress Notes (Signed)
Hearing Screening - Comments:: Passed whisper test Vision Screening - Comments:: May 2023  

## 2022-05-11 NOTE — Assessment & Plan Note (Signed)
Does well with gabapentin 300 at bedtime Will try increasing to '600mg'$ --to see if that helps her sleep better (and not need the sonata)

## 2022-05-11 NOTE — Assessment & Plan Note (Signed)
On vitamin D Would not take bisphosphate ----so will not recheck DEXA

## 2022-05-11 NOTE — Assessment & Plan Note (Signed)
Uses the support hose

## 2022-05-11 NOTE — Assessment & Plan Note (Signed)
I have personally reviewed the Medicare Annual Wellness questionnaire and have noted  1. The patient's medical and social history  2. Their use of alcohol, tobacco or illicit drugs  3. Their current medications and supplements  4. The patient's functional ability including ADL's, fall risks, home safety risks and hearing or visual              impairment.  5. Diet and physical activities  6. Evidence for depression or mood disorders  The patients weight, height, BMI and visual acuity have been recorded in the chart  I have made referrals, counseling and provided education to the patient based review of the above and I have provided the pt with a written personalized care plan for preventive services.   I have provided you with a copy of your personalized plan for preventive services. Please take the time to review along with your updated medication list.  Done with cancer screening Flu vaccine already.  New COVID soon Td at pharmacy Exercises regularly

## 2022-05-17 DIAGNOSIS — D0439 Carcinoma in situ of skin of other parts of face: Secondary | ICD-10-CM | POA: Diagnosis not present

## 2022-05-20 DIAGNOSIS — Z1331 Encounter for screening for depression: Secondary | ICD-10-CM | POA: Diagnosis not present

## 2022-05-20 DIAGNOSIS — I10 Essential (primary) hypertension: Secondary | ICD-10-CM | POA: Diagnosis not present

## 2022-05-20 DIAGNOSIS — M810 Age-related osteoporosis without current pathological fracture: Secondary | ICD-10-CM | POA: Diagnosis not present

## 2022-05-20 DIAGNOSIS — E785 Hyperlipidemia, unspecified: Secondary | ICD-10-CM | POA: Diagnosis not present

## 2022-06-24 ENCOUNTER — Other Ambulatory Visit: Payer: Self-pay | Admitting: Internal Medicine

## 2022-06-24 NOTE — Telephone Encounter (Signed)
Last filled 05-11-22 #30 Last OV 05-11-22 Next OV 05-17-23 [facility]

## 2022-08-10 ENCOUNTER — Other Ambulatory Visit: Payer: Self-pay | Admitting: Internal Medicine

## 2022-08-17 ENCOUNTER — Other Ambulatory Visit: Payer: Self-pay | Admitting: Internal Medicine

## 2022-08-27 ENCOUNTER — Other Ambulatory Visit: Payer: Self-pay | Admitting: Internal Medicine

## 2022-09-13 ENCOUNTER — Emergency Department: Payer: PPO

## 2022-09-13 ENCOUNTER — Encounter: Payer: Self-pay | Admitting: Emergency Medicine

## 2022-09-13 ENCOUNTER — Emergency Department
Admission: EM | Admit: 2022-09-13 | Discharge: 2022-09-13 | Disposition: A | Discharge status: Home / Self Care | Payer: PPO | Attending: Emergency Medicine | Admitting: Emergency Medicine

## 2022-09-13 ENCOUNTER — Other Ambulatory Visit: Payer: Self-pay

## 2022-09-13 DIAGNOSIS — K529 Noninfective gastroenteritis and colitis, unspecified: Secondary | ICD-10-CM | POA: Insufficient documentation

## 2022-09-13 DIAGNOSIS — R197 Diarrhea, unspecified: Secondary | ICD-10-CM | POA: Diagnosis present

## 2022-09-13 DIAGNOSIS — K8020 Calculus of gallbladder without cholecystitis without obstruction: Secondary | ICD-10-CM | POA: Diagnosis not present

## 2022-09-13 DIAGNOSIS — K625 Hemorrhage of anus and rectum: Secondary | ICD-10-CM | POA: Insufficient documentation

## 2022-09-13 DIAGNOSIS — N179 Acute kidney failure, unspecified: Secondary | ICD-10-CM | POA: Insufficient documentation

## 2022-09-13 DIAGNOSIS — I10 Essential (primary) hypertension: Secondary | ICD-10-CM | POA: Insufficient documentation

## 2022-09-13 DIAGNOSIS — R109 Unspecified abdominal pain: Secondary | ICD-10-CM | POA: Diagnosis not present

## 2022-09-13 DIAGNOSIS — I700 Atherosclerosis of aorta: Secondary | ICD-10-CM | POA: Diagnosis not present

## 2022-09-13 LAB — COMPREHENSIVE METABOLIC PANEL
ALT: 18 U/L (ref 0–44)
AST: 25 U/L (ref 15–41)
Albumin: 3.9 g/dL (ref 3.5–5.0)
Alkaline Phosphatase: 76 U/L (ref 38–126)
Anion gap: 9 (ref 5–15)
BUN: 31 mg/dL — ABNORMAL HIGH (ref 8–23)
CO2: 24 mmol/L (ref 22–32)
Calcium: 9.3 mg/dL (ref 8.9–10.3)
Chloride: 102 mmol/L (ref 98–111)
Creatinine, Ser: 1.33 mg/dL — ABNORMAL HIGH (ref 0.44–1.00)
GFR, Estimated: 40 mL/min — ABNORMAL LOW (ref >60)
Glucose, Bld: 124 mg/dL — ABNORMAL HIGH (ref 70–99)
Potassium: 3.7 mmol/L (ref 3.5–5.1)
Sodium: 135 mmol/L (ref 135–145)
Total Bilirubin: 0.9 mg/dL (ref 0.3–1.2)
Total Protein: 6.9 g/dL (ref 6.5–8.1)

## 2022-09-13 LAB — TYPE AND SCREEN
ABO/RH(D): B POS
Antibody Screen: NEGATIVE

## 2022-09-13 LAB — CBC
HCT: 38.1 % (ref 36.0–46.0)
Hemoglobin: 13 g/dL (ref 12.0–15.0)
MCH: 29.7 pg (ref 26.0–34.0)
MCHC: 34.1 g/dL (ref 30.0–36.0)
MCV: 87 fL (ref 80.0–100.0)
Platelets: 286 10*3/uL (ref 150–400)
RBC: 4.38 MIL/uL (ref 3.87–5.11)
RDW: 12.9 % (ref 11.5–15.5)
WBC: 9.4 10*3/uL (ref 4.0–10.5)
nRBC: 0 % (ref 0.0–0.2)

## 2022-09-13 LAB — LIPASE, BLOOD: Lipase: 44 U/L (ref 11–51)

## 2022-09-13 MED ORDER — CIPROFLOXACIN HCL 500 MG PO TABS: 500.0000 mg | ORAL_TABLET | Freq: Two times a day (BID) | ORAL | 0 refills | Status: AC

## 2022-09-13 MED ORDER — IOHEXOL 300 MG/ML  SOLN
100.0000 mL | Freq: Once | INTRAMUSCULAR | Status: AC | PRN
  Administered 2022-09-13: 100 mL via INTRAVENOUS

## 2022-09-13 MED ORDER — LACTATED RINGERS IV BOLUS
1000.0000 mL | Freq: Once | INTRAVENOUS | Status: AC
  Administered 2022-09-13: 1000 mL via INTRAVENOUS

## 2022-09-13 MED ORDER — ONDANSETRON 4 MG PO TBDP: 4.0000 mg | ORAL_TABLET | Freq: Three times a day (TID) | ORAL | 0 refills | Status: DC | PRN

## 2022-09-13 MED ORDER — CIPROFLOXACIN HCL 500 MG PO TABS: 500.0000 mg | ORAL_TABLET | Freq: Two times a day (BID) | ORAL | 0 refills | Status: DC

## 2022-09-13 NOTE — ED Provider Notes (Signed)
[facility] Provider Note    Event Date/Time   First MD Initiated Contact with Patient 09/13/22 [phone]     (approximate)   History   Chief Complaint Diarrhea (/)   HPI  Kathryn Love is a 82 y.o. female with past medical history of hypertension and hyperlipidemia who presents to the ED complaining of diarrhea.  Patient reports that she has been dealing with approximately 1 week of watery and mucousy stool.  This been associated with increasing pain in her abdomen diffusely, which she describes as crampy.  She was feeling nauseous and had a couple episodes of vomiting around when the symptoms first started, has not had any further vomiting since then but continues to feel nauseous.  She denies any fevers, cough, chest pain, shortness of breath, dysuria, or hematuria.  When she had diarrhea earlier today, she noticed some bright red blood mixed with her stool.  She reports a history of hemorrhoids but never has had issues with these bleeding before.     Physical Exam   Triage Vital Signs: ED Triage Vitals [09/13/22 0941]  Enc Vitals Group     BP (!) 112/57     Pulse Rate 70     Resp 18     Temp 98.3 F (36.8 C)     Temp Source Oral     SpO2 98 %     Weight      Height      Head Circumference      Peak Flow      Pain Score 0     Pain Loc      Pain Edu?      Excl. in [place]?     Most recent vital signs: Vitals:   09/13/22 1030 09/13/22 1100  BP: 117/61 115/60  Pulse: 64 66  Resp:    Temp:    SpO2: 96% 97%    Constitutional: Alert and oriented. Eyes: Conjunctivae are normal. Head: Atraumatic. Nose: No congestion/rhinnorhea. Mouth/Throat: Mucous membranes are moist.  Cardiovascular: Normal rate, regular rhythm. Grossly normal heart sounds.  2+ radial pulses bilaterally. Respiratory: Normal respiratory effort.  No retractions. Lungs CTAB. Gastrointestinal: Soft and diffusely tender to palpation with no rebound or guarding. No distention.  Rectal  exam shows external hemorrhoid with no active bleeding noted, stool is guaiac negative. Musculoskeletal: No lower extremity tenderness nor edema.  Neurologic:  Normal speech and language. No gross focal neurologic deficits are appreciated.    ED Results / Procedures / Treatments   Labs (all labs ordered are listed, but only abnormal results are displayed) Labs Reviewed  COMPREHENSIVE METABOLIC PANEL - Abnormal; Notable for the following components:      Result Value   Glucose, Bld 124 (*)    BUN 31 (*)    Creatinine, Ser 1.33 (*)    GFR, Estimated 40 (*)    All other components within normal limits  CBC  LIPASE, BLOOD  POC OCCULT BLOOD, ED  TYPE AND SCREEN   RADIOLOGY CT imaging reviewed and interpreted by me, consistent with colitis, no focal fluid collections or dilated bowel loops noted.  PROCEDURES:  Critical Care performed: No  Procedures   MEDICATIONS ORDERED IN ED: Medications  lactated ringers bolus 1,000 mL (0 mLs Intravenous Stopped 09/13/22 1246)  iohexol (OMNIPAQUE) 300 MG/ML solution 100 mL (100 mLs Intravenous Contrast Given 09/13/22 1138)     IMPRESSION / MDM / ASSESSMENT AND PLAN / ED COURSE  I reviewed the triage vital signs  and the nursing notes.                              82 y.o. female with past medical history of hypertension and hyperlipidemia who presents to the ED with 1 week of diarrhea, now with worsening abdominal pain and blood in her stool.  Patient's presentation is most consistent with acute presentation with potential threat to life or bodily function.  Differential diagnosis includes, but is not limited to, rectal bleeding, lower GI bleed, upper GI bleed, anemia, colitis, diverticulitis, AKI, electrolyte abnormality.  Patient nontoxic-appearing and in no acute distress, vital signs are unremarkable.  Abdominal exam with diffuse tenderness, rectal exam shows hemorrhoid with no active bleeding, stool is guaiac negative.  Patient with  isolated episode of what sounds like rectal bleeding, will monitor for ongoing bleeding here in the ED but CBC is reassuring with stable hemoglobin and no significant leukocytosis.  Given her tenderness, we will further assess with CT imaging of her abdomen/pelvis, additional labs are pending.  Patient declines pain or nausea medication at this time, will hydrate with IV fluids.  Additional labs remarkable for mild AKI with no acute electrolyte abnormality, LFTs and lipase are unremarkable.  CT imaging consistent with colitis, no other acute findings noted.  Patient with no further bowel movements here in the ED, has not had any bleeding here.  She was offered admission for observation and hydration for AKI, but prefers to be discharged home with close PCP follow-up.  She is tolerating oral intake without difficulty so this seems reasonable.  Given duration of symptoms and association with small amount of bleeding, she would benefit from course of antibiotics.  We will also prescribe Zofran for use as needed.  She was counseled to follow-up with her PCP for recheck of renal function, otherwise return to the ED for new or worsening symptoms.  Patient agrees with plan.      FINAL CLINICAL IMPRESSION(S) / ED DIAGNOSES   Final diagnoses:  Colitis  Rectal bleeding  AKI (acute kidney injury) ([place])     Rx / DC Orders   ED Discharge Orders          Ordered    ondansetron (ZOFRAN-ODT) 4 MG disintegrating tablet  Every 8 hours PRN        09/13/22 1319    ciprofloxacin (CIPRO) 500 MG tablet  2 times daily        09/13/22 1319             Note:  This document was prepared using Dragon voice recognition software and may include unintentional dictation errors.   Blake Divine, MD 09/13/22 1321

## 2022-09-13 NOTE — ED Triage Notes (Signed)
Patient to ED from [facility] for diarrhea x5 days. Pt states she has been doing the [employer] and not getting better. Had one episode of bright red blood in stool this AM- possible hemorrhoid. Denies blood thinners.

## 2022-09-17 ENCOUNTER — Ambulatory Visit (INDEPENDENT_AMBULATORY_CARE_PROVIDER_SITE_OTHER): Payer: PPO | Admitting: Internal Medicine

## 2022-09-17 ENCOUNTER — Encounter: Payer: Self-pay | Admitting: Internal Medicine

## 2022-09-17 VITALS — BP 120/74 | HR 72 | Temp 97.5°F | Ht 58.75 in | Wt 124.0 lb

## 2022-09-17 DIAGNOSIS — N1831 Chronic kidney disease, stage 3a: Secondary | ICD-10-CM | POA: Insufficient documentation

## 2022-09-17 DIAGNOSIS — K8020 Calculus of gallbladder without cholecystitis without obstruction: Secondary | ICD-10-CM | POA: Diagnosis not present

## 2022-09-17 DIAGNOSIS — K529 Noninfective gastroenteritis and colitis, unspecified: Secondary | ICD-10-CM

## 2022-09-17 DIAGNOSIS — N179 Acute kidney failure, unspecified: Secondary | ICD-10-CM | POA: Diagnosis not present

## 2022-09-17 LAB — RENAL FUNCTION PANEL
Albumin: 3.6 g/dL (ref 3.5–5.2)
BUN: 15 mg/dL (ref 6–23)
CO2: 29 mEq/L (ref 19–32)
Calcium: 9.1 mg/dL (ref 8.4–10.5)
Chloride: 101 mEq/L (ref 96–112)
Creatinine, Ser: 1.13 mg/dL (ref 0.40–1.20)
GFR: 45.22 mL/min — ABNORMAL LOW (ref >60.00)
Glucose, Bld: 92 mg/dL (ref 70–99)
Phosphorus: 2.7 mg/dL (ref 2.3–4.6)
Potassium: 3.9 mEq/L (ref 3.5–5.1)
Sodium: 136 mEq/L (ref 135–145)

## 2022-09-17 NOTE — Assessment & Plan Note (Signed)
Discussed that this is an incidental finding but further attention should be done if she gets post prandial symptoms

## 2022-09-17 NOTE — Assessment & Plan Note (Signed)
Viral vs bacterial Improved after IV fluids Did have 5 days cipro course which she is finishing Okay to resume normal eating

## 2022-09-17 NOTE — Progress Notes (Signed)
Subjective:    Patient ID: Kathryn Love, female    DOB: Dec 02, 1940, 82 y.o.   MRN: [id number]  HPI Here with husband for ER follow up  Was sick for 5-6 days---diarrhea as much as 20 times a day Intermittent abdominal pain Had some blood--but sure this was from the hemorrhoid Vomited only once---just clear stuff and mucus No fever--but did feel cold/shaky Little oral intake--some soup and bananas  [place]--and had salad the night before this started (but husband never had any symptoms)  Finally went to ER 2/26----after Merlene Morse RN checked her Reviewed ER records Got IV fluids (1 bag) AKI noted on labs--otherwise unremarkable CT showed inflammation consistent with colitis (and incidental diverticulosis and gall stones) No diarrhea after ER visit and increased fiber Diet increased after that Did give her cipro to take--this is the last day  Current Outpatient Medications on File Prior to Visit  Medication Sig Dispense Refill   amLODipine (NORVASC) 5 MG tablet TAKE 1 TABLET BY MOUTH ONCE DAILY. 90 tablet 3   calcium carbonate (OS-CAL - DOSED IN MG OF ELEMENTAL CALCIUM) 1250 (500 Ca) MG tablet Take 1 tablet by mouth daily with breakfast.     Cholecalciferol 50 MCG (2000 UT) TABS Take by mouth daily.     ciprofloxacin (CIPRO) 500 MG tablet Take 1 tablet (500 mg total) by mouth 2 (two) times daily for 5 days. 10 tablet 0   Cyanocobalamin (B-12) 5000 MCG CAPS Take by mouth.     gabapentin (NEURONTIN) 300 MG capsule Take 1-2 capsules (300-600 mg total) by mouth at bedtime. For restless legs 60 capsule 11   losartan (COZAAR) 100 MG tablet TAKE 1 TABLET BY MOUTH DAILY 90 tablet 3   Magnesium 250 MG TABS Take 250 mg by mouth daily.     Multiple Vitamin (MULTIVITAMIN) capsule Take 1 capsule by mouth daily.     nitrofurantoin (MACRODANTIN) 50 MG capsule Take 50 mg by mouth as needed.     ondansetron (ZOFRAN-ODT) 4 MG disintegrating tablet Take 1 tablet (4 mg total) by mouth every 8  (eight) hours as needed for nausea or vomiting. 12 tablet 0   pantoprazole (PROTONIX) 40 MG tablet TAKE 1 TABLET BY MOUTH DAILY 90 tablet 3   zaleplon (SONATA) 5 MG capsule TAKE 1 CAPSULE BY MOUTH AT BEDTIME 30 capsule 0   No current facility-administered medications on file prior to visit.    No Known Allergies  Past Medical History:  Diagnosis Date   Essential (primary) hypertension 08/14/2014   Last Assessment & Plan:  Blood pressure has been controlled without significant dizzyness or associated fatigue. Taking antihypertensives as directed without difficulty.     HLD (hyperlipidemia) 08/14/2014   Last Assessment & Plan:  Has been working on a low fat diet and no myalgia's are present.     Recurrent UTI    Restless leg 08/14/2014   Last Assessment & Plan:  Gabapentin and Lorrin Mais are controlling symptoms reasonably well.      Past Surgical History:  Procedure Laterality Date   ABDOMINOPLASTY  1997   BREAST CYST EXCISION Right 1960   COLONOSCOPY WITH PROPOFOL N/A 09/15/2015   Procedure: COLONOSCOPY WITH PROPOFOL;  Surgeon: Josefine Class, MD;  Location: [facility] ENDOSCOPY;  Service: Endoscopy;  Laterality: N/A;   TONSILLECTOMY  1947    Family History  Problem Relation Age of Onset   Bladder Cancer Mother    Breast cancer Maternal Grandmother 45       Unsure  Prostate cancer Neg Hx    Kidney cancer Neg Hx     Social History   Socioeconomic History   Marital status: Married    Spouse name: Not on file   Number of children: Not on file   Years of education: Not on file   Highest education level: Not on file  Occupational History   Occupation: Real estate    Comment: retired   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   Social Determinants of Health   Financial Resource Strain: Not on file  Food Insecurity: Not on file  Transportation Needs: Not on file  Physical Activity: Not on file  Stress: Not on file  Social Connections: Not on file  Intimate Partner Violence: Not on file   Review of Systems Sleeping fine Appetite and eating are back to normal     Objective:   Physical Exam Constitutional:      Appearance: Normal appearance.  Cardiovascular:     Rate and Rhythm: Normal rate and regular rhythm.     Heart sounds: No murmur heard.    No gallop.  Pulmonary:     Effort: Pulmonary effort is normal.     Breath sounds: Normal breath sounds. No wheezing or rales.  Abdominal:     General: Bowel sounds are normal.     Palpations: Abdomen is soft.     Tenderness: There is no abdominal tenderness. There is no guarding.     Comments: Slight distension  Musculoskeletal:     Cervical back: Neck supple.     Right lower leg: No edema.     Left lower leg: No edema.  Lymphadenopathy:     Cervical: No cervical adenopathy.  Neurological:     Mental Status: She is alert.            Assessment & Plan:

## 2022-09-17 NOTE — Assessment & Plan Note (Signed)
BUN, creatinine increased with the illness Will recheck renal profile now as she seems to be rehydrated

## 2022-09-20 ENCOUNTER — Telehealth: Payer: Self-pay

## 2022-09-20 NOTE — Telephone Encounter (Signed)
        Patient  visited [facility] on 2/26     Telephone encounter attempt :  1st  A HIPAA compliant voice message was left requesting a return call.  Instructed patient to call back .    [facility] [phone] [address], [place], [place] [address] Phone: [phone] Email: [email or web address]

## 2022-10-05 ENCOUNTER — Other Ambulatory Visit: Payer: Self-pay | Admitting: Internal Medicine

## 2022-12-01 DIAGNOSIS — H35363 Drusen (degenerative) of macula, bilateral: Secondary | ICD-10-CM | POA: Diagnosis not present

## 2022-12-01 DIAGNOSIS — H348112 Central retinal vein occlusion, right eye, stable: Secondary | ICD-10-CM | POA: Diagnosis not present

## 2022-12-01 DIAGNOSIS — H40003 Preglaucoma, unspecified, bilateral: Secondary | ICD-10-CM | POA: Diagnosis not present

## 2022-12-01 DIAGNOSIS — Z961 Presence of intraocular lens: Secondary | ICD-10-CM | POA: Diagnosis not present

## 2022-12-10 DIAGNOSIS — L578 Other skin changes due to chronic exposure to nonionizing radiation: Secondary | ICD-10-CM | POA: Diagnosis not present

## 2022-12-10 DIAGNOSIS — L821 Other seborrheic keratosis: Secondary | ICD-10-CM | POA: Diagnosis not present

## 2022-12-10 DIAGNOSIS — L570 Actinic keratosis: Secondary | ICD-10-CM | POA: Diagnosis not present

## 2022-12-30 IMAGING — CR DG CHEST 2V
2 series · 2 of 2 positions shown · non-contrast
Comparison: Report of prior chest radiograph August 19, 2016; no
images from that study available.

CLINICAL DATA: Chest pain and hypertension

EXAM:
CHEST - 2 VIEW

[chest pa]
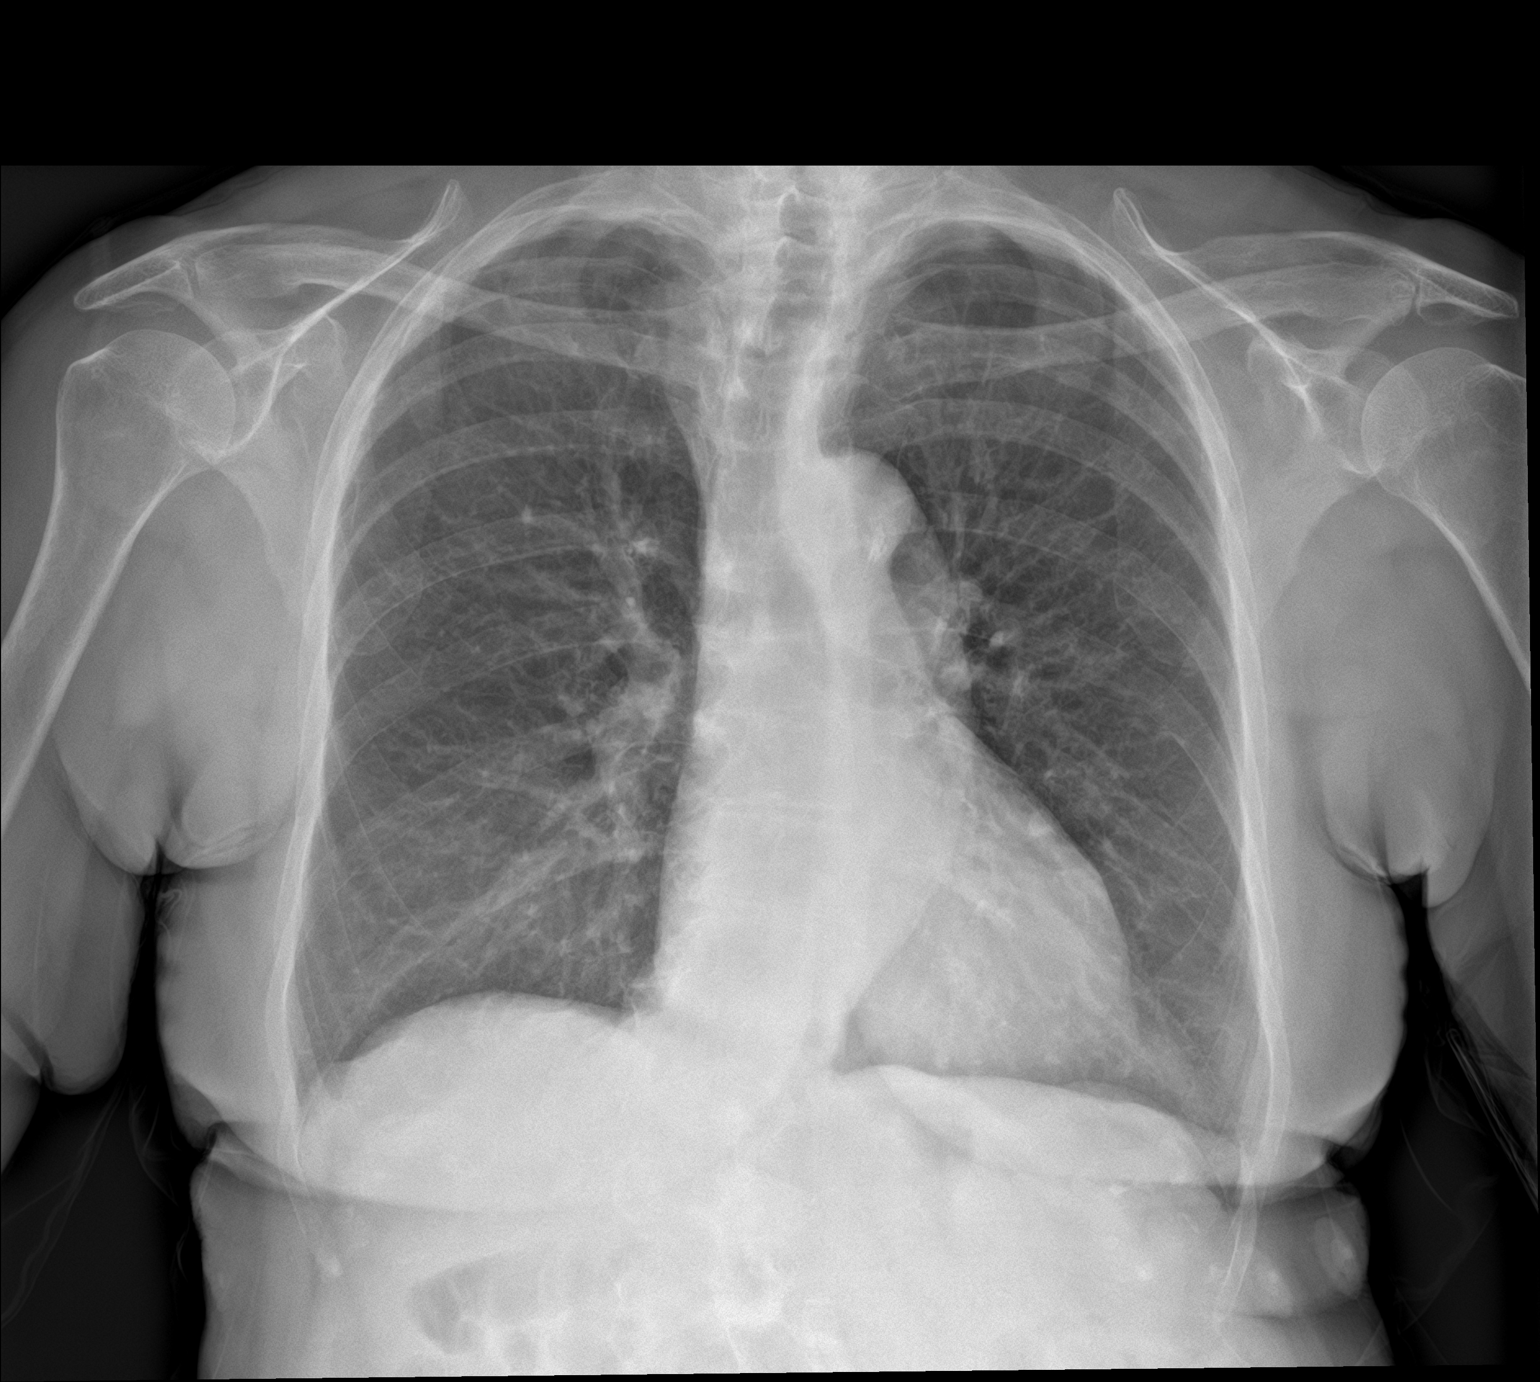

[chest lat]
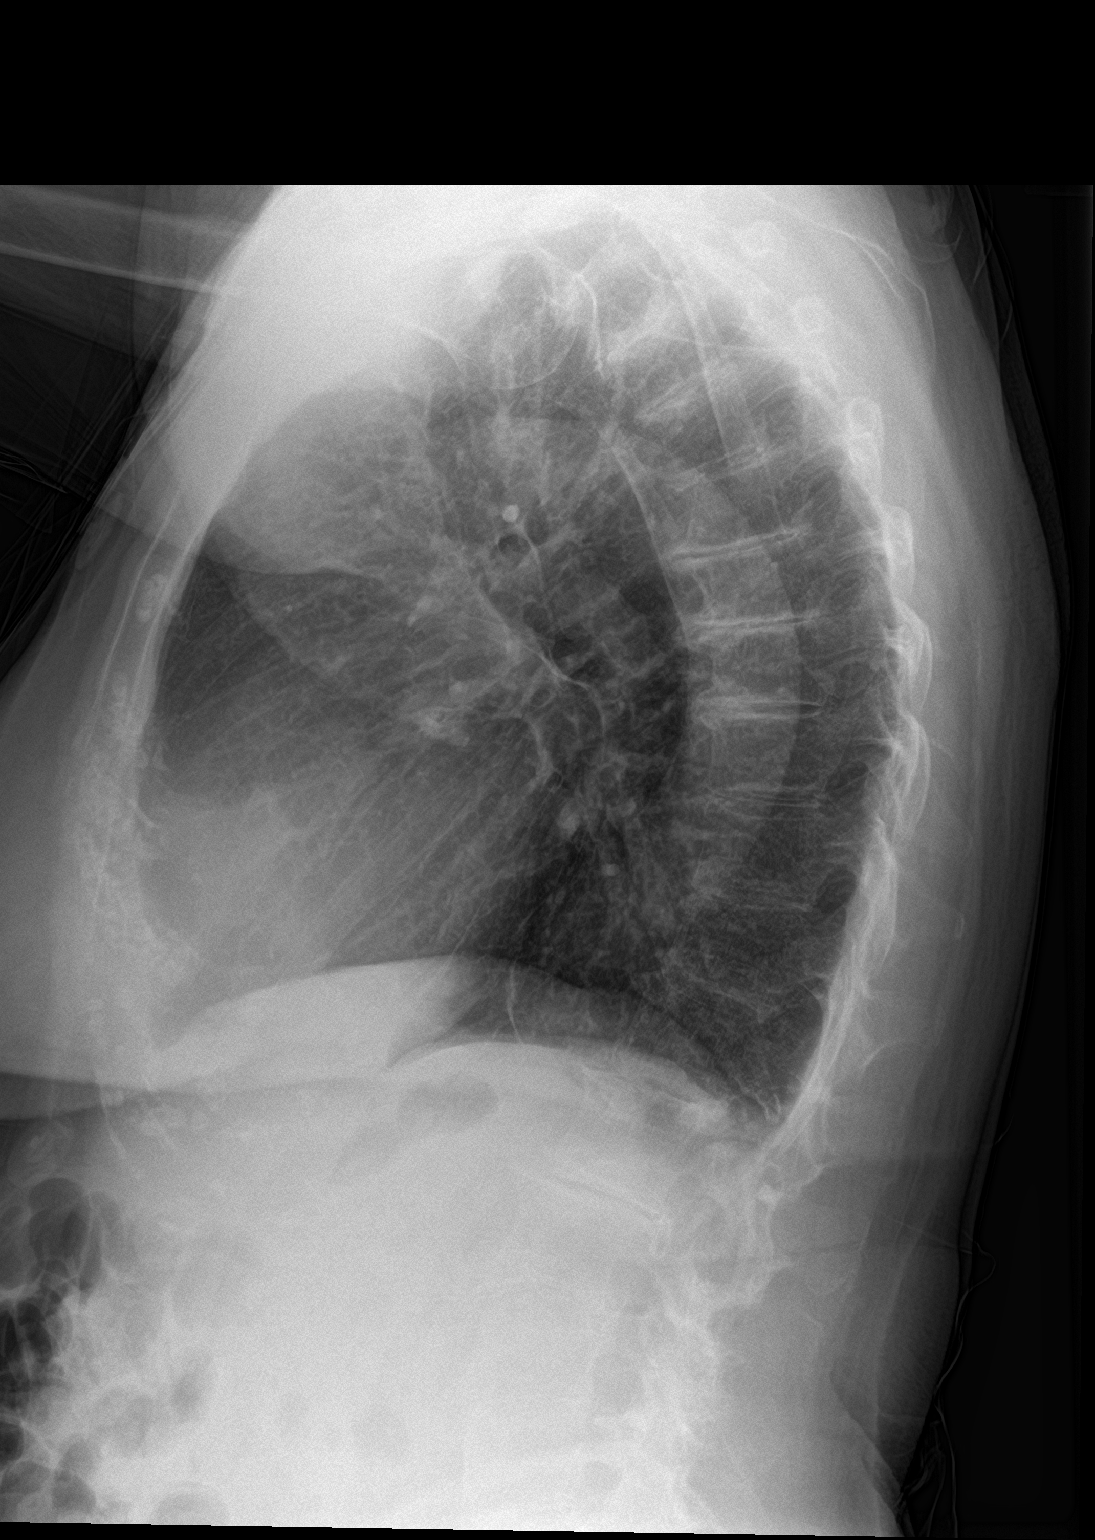

[2 of 2 positions shown; findings below may reference images not displayed]

FINDINGS: Lungs are clear. Heart size and pulmonary vascularity are normal. No
adenopathy. There is mild aortic tortuosity with aortic
atherosclerosis. There is degenerative change in the thoracic spine.
IMPRESSION: Lungs clear. Heart size normal. Mild aortic tortuosity. Aortic
Atherosclerosis ([id number]).

## 2023-01-24 IMAGING — CT CT CARDIAC CORONARY ARTERY CALCIUM SCORE
2 series · 16 of 20 positions shown, 18 images · non-contrast
Comparison: None.
COMPARISON: None.

Addendum:
EXAM:
OVER-READ INTERPRETATION  CT CHEST

The following report is an over-read performed by radiologist Dr.
Fdhil Zinaoui [REDACTED] on 11/14/2020. This
over-read does not include interpretation of cardiac or coronary
anatomy or pathology. The coronary calcium score interpretation by
the cardiologist is attached.
CLINICAL DATA: Risk stratification
Coronary Calcium Score
TECHNIQUE: The patient was scanned on a Siemens Somatom Definition AS scanner.
Axial non-contrast 3 mm slices were carried out through the heart.
The data set was analyzed on a dedicated work station and scored
using the Agatson method.

[Series 3: lung st 68 % · axial · 0.55mm/px · z∈[+1957,+2074]mm · 8 of 51 slices shown]
[im 6/51  lung]
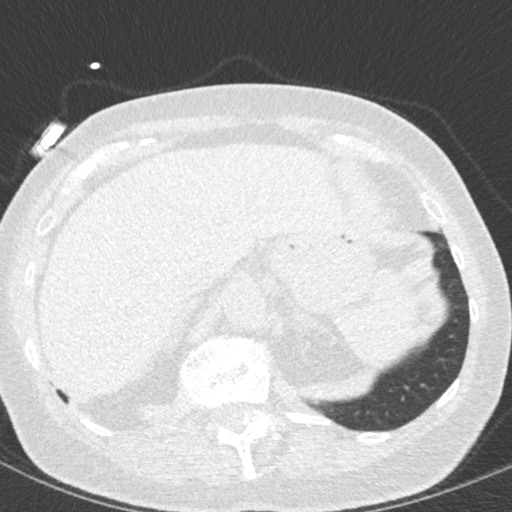
[im 12/51  lung]
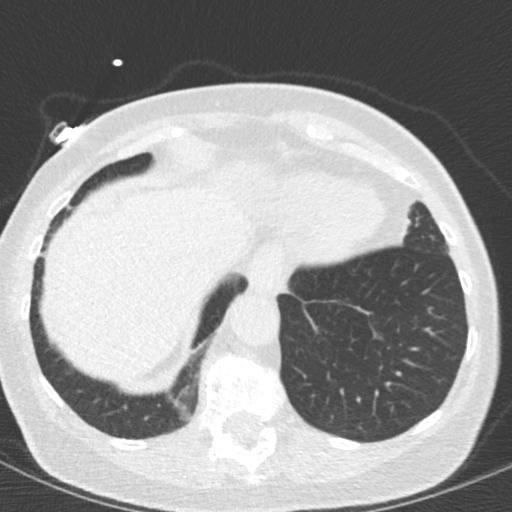
[im 17/51  lung]
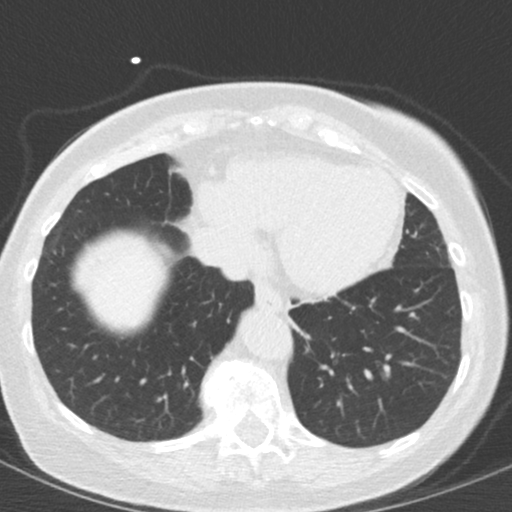
[im 23/51  lung]
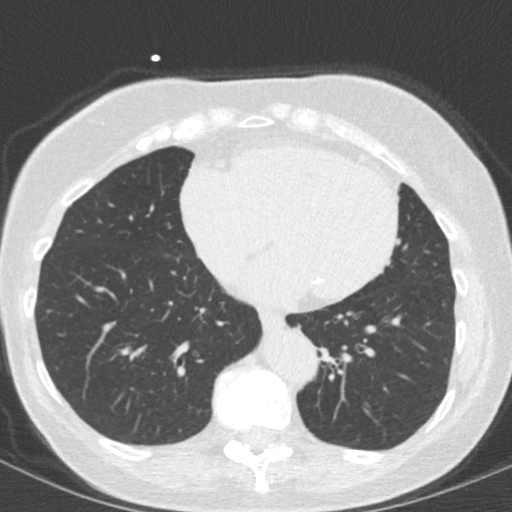
[im 28/51  lung]
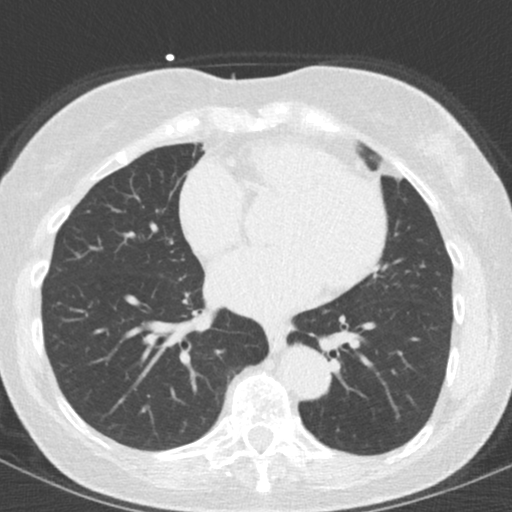
[im 34/51  lung]
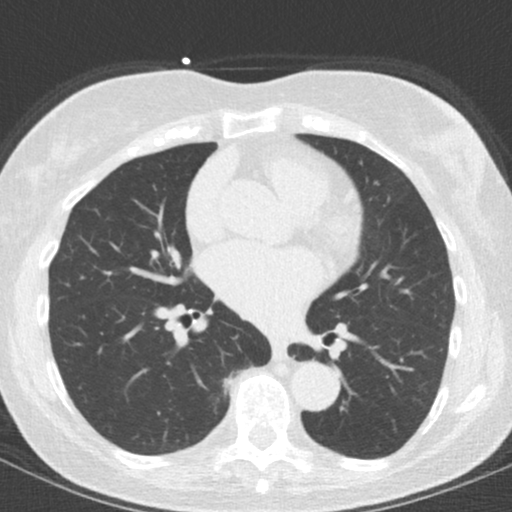
[im 39/51  lung]
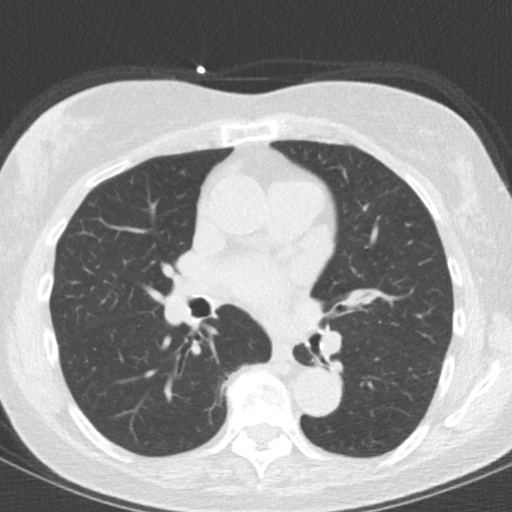
[im 45/51  lung]
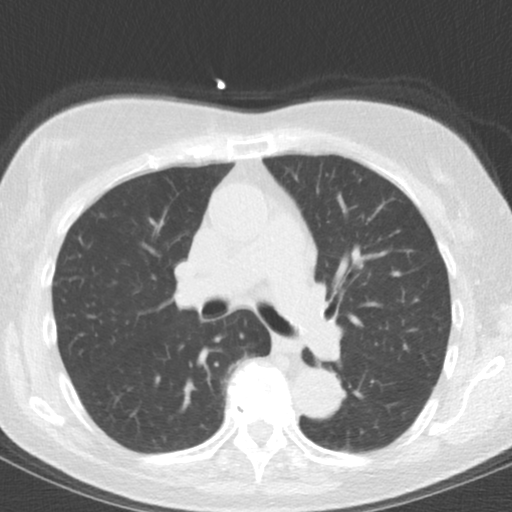

[Series 6: casc 3.0 i36f 2 bestdiast 66 % · axial · 0.34mm/px · z∈[+1966,+2074]mm · 8 of 48 slices shown, 10 images]
[im 6/48  vessel]
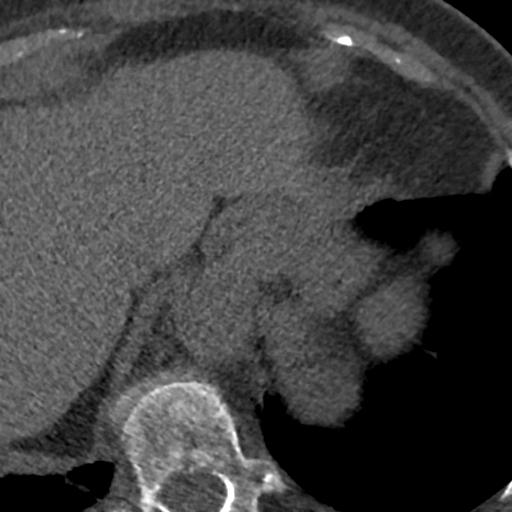
[im 6/48  lung]
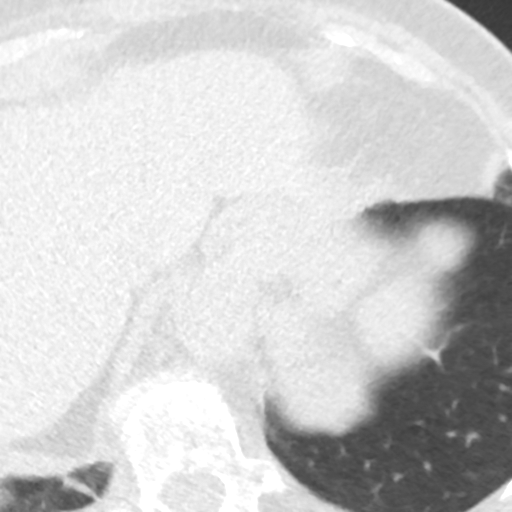
[im 11/48  vessel]
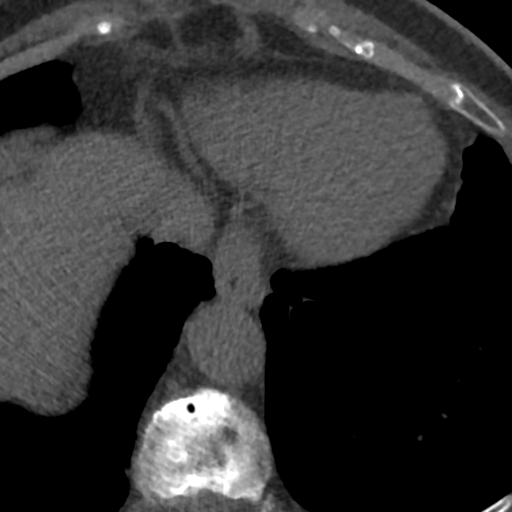
[im 16/48  vessel]
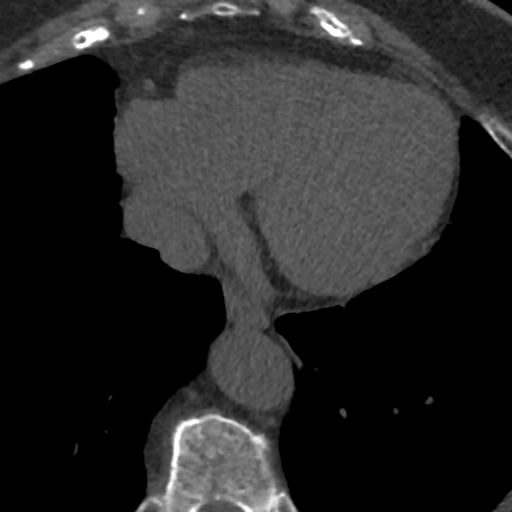
[im 21/48  vessel]
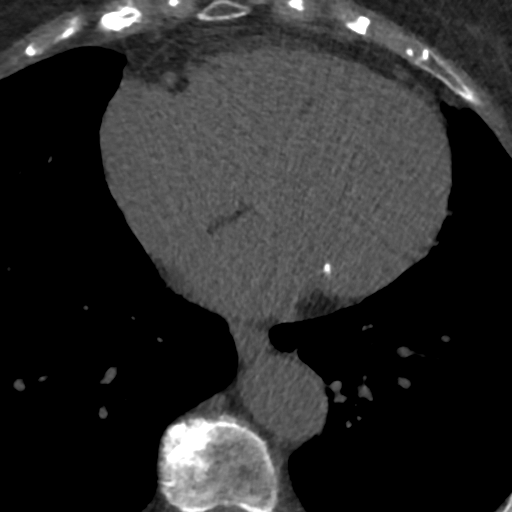
[im 27/48  vessel]
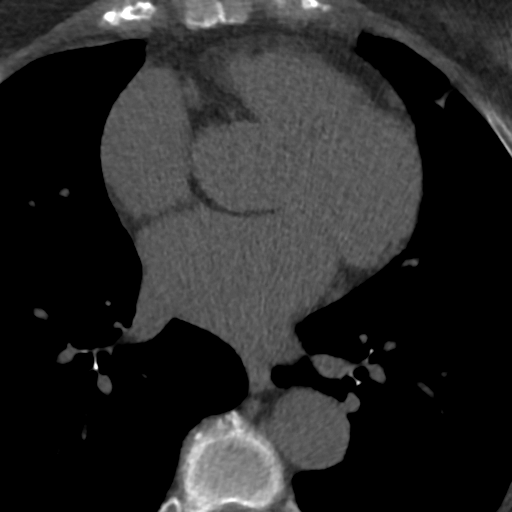
[im 27/48  lung]
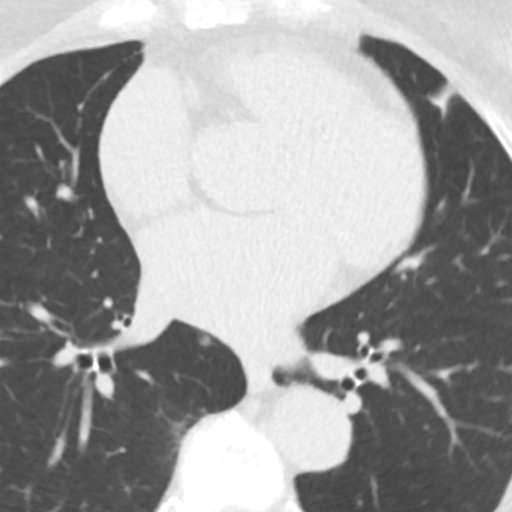
[im 32/48  vessel]
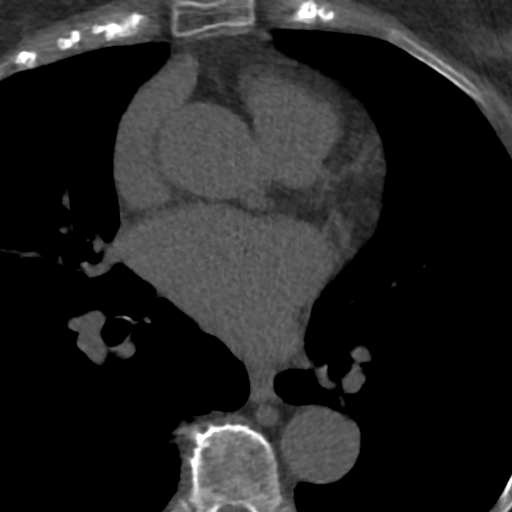
[im 37/48  vessel]
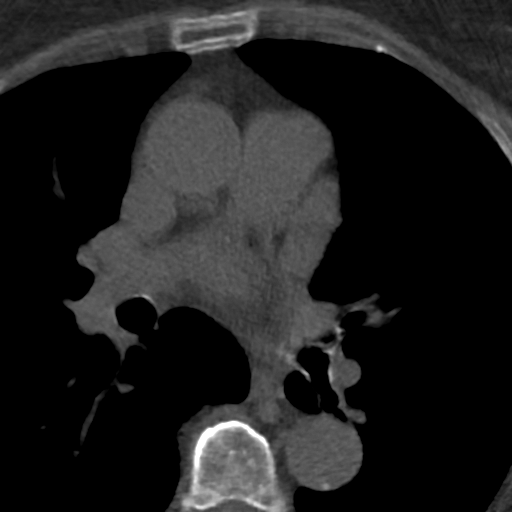
[im 42/48  vessel]
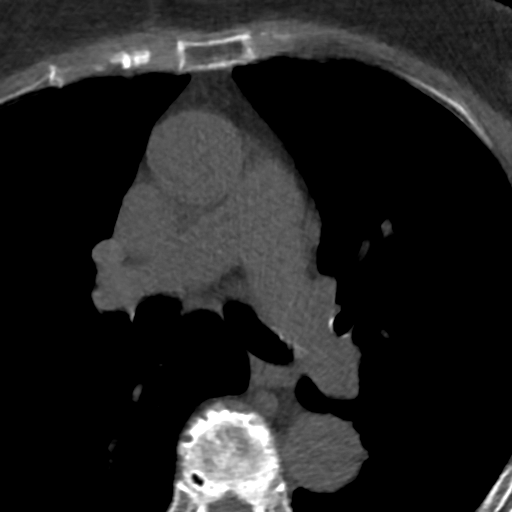

[16 of 20 positions shown; findings below may reference images not displayed]

FINDINGS: Aortic atherosclerosis. Within the visualized portions of the thorax
there are no suspicious appearing pulmonary nodules or masses, there
is no acute consolidative airspace disease, no pleural effusions, no
pneumothorax and no lymphadenopathy. Visualized portions of the
upper abdomen are unremarkable. There are no aggressive appearing
lytic or blastic lesions noted in the visualized portions of the
skeleton.
IMPRESSION: 1.  Aortic Atherosclerosis (YA5G5-77A.A).
FINDINGS: Non-cardiac: See separate report from [REDACTED].

Ascending Aorta: Normal size

Pericardium: Normal

Coronary arteries: Normal origin of left and right coronary
arteries. Distribution of arterial calcifications if present, as
noted below;

LM 0

LAD 0

LCx 0

RCA 0

Total 0

IMPRESSION AND RECOMMENDATION:
1. Normal coronary calcium score of 0. Patient is low risk for
coronary events.

2.  CAC 0, RACKEEM BAXLEY0.

3.  Continue heart healthy lifestyle and risk factor modification.

Chabelo Kiefer

*** End of Addendum ***
EXAM:
OVER-READ INTERPRETATION  CT CHEST

The following report is an over-read performed by radiologist Dr.
Fdhil Zinaoui [REDACTED] on 11/14/2020. This
over-read does not include interpretation of cardiac or coronary
anatomy or pathology. The coronary calcium score interpretation by
the cardiologist is attached.
FINDINGS: Aortic atherosclerosis. Within the visualized portions of the thorax
there are no suspicious appearing pulmonary nodules or masses, there
is no acute consolidative airspace disease, no pleural effusions, no
pneumothorax and no lymphadenopathy. Visualized portions of the
upper abdomen are unremarkable. There are no aggressive appearing
lytic or blastic lesions noted in the visualized portions of the
skeleton.
IMPRESSION: 1.  Aortic Atherosclerosis (YA5G5-77A.A).

## 2023-01-31 ENCOUNTER — Other Ambulatory Visit: Payer: Self-pay | Admitting: Internal Medicine

## 2023-01-31 NOTE — Telephone Encounter (Signed)
Last filled 08-27-22 #30 Last OV 05-11-22 Next OV 05-17-23 [facility]

## 2023-03-07 DIAGNOSIS — D2271 Melanocytic nevi of right lower limb, including hip: Secondary | ICD-10-CM | POA: Diagnosis not present

## 2023-03-07 DIAGNOSIS — C44529 Squamous cell carcinoma of skin of other part of trunk: Secondary | ICD-10-CM | POA: Diagnosis not present

## 2023-03-07 DIAGNOSIS — D485 Neoplasm of uncertain behavior of skin: Secondary | ICD-10-CM | POA: Diagnosis not present

## 2023-03-07 DIAGNOSIS — L298 Other pruritus: Secondary | ICD-10-CM | POA: Diagnosis not present

## 2023-03-07 DIAGNOSIS — D2272 Melanocytic nevi of left lower limb, including hip: Secondary | ICD-10-CM | POA: Diagnosis not present

## 2023-03-07 DIAGNOSIS — R208 Other disturbances of skin sensation: Secondary | ICD-10-CM | POA: Diagnosis not present

## 2023-03-07 DIAGNOSIS — Z85828 Personal history of other malignant neoplasm of skin: Secondary | ICD-10-CM | POA: Diagnosis not present

## 2023-03-07 DIAGNOSIS — D2261 Melanocytic nevi of right upper limb, including shoulder: Secondary | ICD-10-CM | POA: Diagnosis not present

## 2023-03-07 DIAGNOSIS — D225 Melanocytic nevi of trunk: Secondary | ICD-10-CM | POA: Diagnosis not present

## 2023-04-06 ENCOUNTER — Other Ambulatory Visit: Payer: Self-pay | Admitting: Internal Medicine

## 2023-04-18 DIAGNOSIS — C44529 Squamous cell carcinoma of skin of other part of trunk: Secondary | ICD-10-CM | POA: Diagnosis not present

## 2023-04-18 DIAGNOSIS — D235 Other benign neoplasm of skin of trunk: Secondary | ICD-10-CM | POA: Diagnosis not present

## 2023-05-12 ENCOUNTER — Other Ambulatory Visit: Payer: Self-pay | Admitting: Internal Medicine

## 2023-05-12 NOTE — Telephone Encounter (Signed)
Last filled 01-31-23 #30 Last OV 05-11-22 Next OV 05-17-23 [facility]

## 2023-05-17 ENCOUNTER — Encounter: Payer: Self-pay | Admitting: Internal Medicine

## 2023-05-17 ENCOUNTER — Telehealth: Payer: Self-pay

## 2023-05-17 ENCOUNTER — Ambulatory Visit (INDEPENDENT_AMBULATORY_CARE_PROVIDER_SITE_OTHER): Payer: PPO | Admitting: Internal Medicine

## 2023-05-17 VITALS — BP 120/72 | HR 61 | Temp 98.7°F | Ht <= 58 in | Wt 121.0 lb

## 2023-05-17 DIAGNOSIS — N1831 Chronic kidney disease, stage 3a: Secondary | ICD-10-CM | POA: Diagnosis not present

## 2023-05-17 DIAGNOSIS — K219 Gastro-esophageal reflux disease without esophagitis: Secondary | ICD-10-CM

## 2023-05-17 DIAGNOSIS — Z0000 Encounter for general adult medical examination without abnormal findings: Secondary | ICD-10-CM

## 2023-05-17 DIAGNOSIS — G2581 Restless legs syndrome: Secondary | ICD-10-CM | POA: Diagnosis not present

## 2023-05-17 DIAGNOSIS — M810 Age-related osteoporosis without current pathological fracture: Secondary | ICD-10-CM | POA: Diagnosis not present

## 2023-05-17 DIAGNOSIS — Z23 Encounter for immunization: Secondary | ICD-10-CM | POA: Diagnosis not present

## 2023-05-17 DIAGNOSIS — I872 Venous insufficiency (chronic) (peripheral): Secondary | ICD-10-CM

## 2023-05-17 DIAGNOSIS — I10 Essential (primary) hypertension: Secondary | ICD-10-CM | POA: Diagnosis not present

## 2023-05-17 LAB — HEPATIC FUNCTION PANEL
ALT: 15 U/L (ref 0–35)
AST: 27 U/L (ref 0–37)
Albumin: 4.4 g/dL (ref 3.5–5.2)
Alkaline Phosphatase: 92 U/L (ref 39–117)
Bilirubin, Direct: 0.2 mg/dL (ref 0.0–0.3)
Total Bilirubin: 0.8 mg/dL (ref 0.2–1.2)
Total Protein: 6.8 g/dL (ref 6.0–8.3)

## 2023-05-17 LAB — RENAL FUNCTION PANEL
Albumin: 4.4 g/dL (ref 3.5–5.2)
BUN: 19 mg/dL (ref 6–23)
CO2: 30 meq/L (ref 19–32)
Calcium: 10.2 mg/dL (ref 8.4–10.5)
Chloride: 103 meq/L (ref 96–112)
Creatinine, Ser: 0.97 mg/dL (ref 0.40–1.20)
GFR: 54.06 mL/min — ABNORMAL LOW (ref >60.00)
Glucose, Bld: 97 mg/dL (ref 70–99)
Phosphorus: 3.3 mg/dL (ref 2.3–4.6)
Potassium: 5.4 meq/L — ABNORMAL HIGH (ref 3.5–5.1)
Sodium: 140 meq/L (ref 135–145)

## 2023-05-17 LAB — CBC
HCT: 41 % (ref 36.0–46.0)
Hemoglobin: 13.2 g/dL (ref 12.0–15.0)
MCHC: 32.1 g/dL (ref 30.0–36.0)
MCV: 90.5 fL (ref 78.0–100.0)
Platelets: 246 10*3/uL (ref 150.0–400.0)
RBC: 4.53 Mil/uL (ref 3.87–5.11)
RDW: 14.2 % (ref 11.5–15.5)
WBC: 6.2 10*3/uL (ref 4.0–10.5)

## 2023-05-17 LAB — VITAMIN D 25 HYDROXY (VIT D DEFICIENCY, FRACTURES): VITD: 119.72 ng/mL (ref 30.00–100.00)

## 2023-05-17 NOTE — Assessment & Plan Note (Signed)
BP Readings from Last 3 Encounters:  05/17/23 120/72  09/17/22 120/74  09/13/22 121/71   Good control on the losartan 100mg  daily and amlodpine 5mg  daily

## 2023-05-17 NOTE — Telephone Encounter (Signed)
Sai with [facility] lab called critical report on Vitamin D level elevated at 119.72 Report is in Epic and logged in lab notebook.Sending to Dr Alphonsus Sias who is in office on 05/18/23. [facility] CMA aware.

## 2023-05-17 NOTE — Assessment & Plan Note (Signed)
Is on the losartan Will check labs again

## 2023-05-17 NOTE — Assessment & Plan Note (Signed)
Uses the gabapentin 300mg  nightly

## 2023-05-17 NOTE — Progress Notes (Signed)
Subjective:    Patient ID: Kathryn Love, female    DOB: [birth year], 82 y.o.   MRN: [id number]  HPI Here with husband for Medicare wellness visit and follow up of chronic health conditions Reviewed advanced directives Reviewed other doctors---Dr King--ophthal, Ms Sandridge--derm, Dr Kemp--dentist, Dr Hyatt--podiatrist No hospitalizations or surgery in the past year Exercises regularly still Vision is fine Hearing is okay Still enjoys red wine with dinner No tobacco No falls No depression or anhedonia Independent with instrumental ADLs Mild memory issues--word finding mostly  No chest pain or SOB No dizziness or syncope Wears compression hose prn--many days. Controls edema No palpitations  Reviewed labs Most recent GFR 45--57 before that  Has pain in right wrist--on radial side Goes back 2 months  Still has sense of something in her throat--when lying down Uses sonata prn Using pantoprazole every other day---no dysphagia  Sees black specks in bowels at times Lots of fruit No melena Bowels move fine--no blood  Current Outpatient Medications on File Prior to Visit  Medication Sig Dispense Refill   amLODipine (NORVASC) 5 MG tablet TAKE 1 TABLET BY MOUTH ONCE DAILY. 90 tablet 3   calcium carbonate (OS-CAL - DOSED IN MG OF ELEMENTAL CALCIUM) 1250 (500 Ca) MG tablet Take 1 tablet by mouth daily with breakfast.     Cholecalciferol 50 MCG (2000 UT) TABS Take by mouth daily.     Cyanocobalamin (B-12) 5000 MCG CAPS Take by mouth.     gabapentin (NEURONTIN) 300 MG capsule TAKE 1 TO 2 CAPSULES (300-600 MG) BY MOUTH AT BEDTIME FOR RESTLESS LEGS 60 capsule 11   losartan (COZAAR) 100 MG tablet TAKE 1 TABLET BY MOUTH DAILY 90 tablet 3   Magnesium 250 MG TABS Take 250 mg by mouth daily.     Multiple Vitamin (MULTIVITAMIN) capsule Take 1 capsule by mouth daily.     nitrofurantoin (MACRODANTIN) 50 MG capsule Take 50 mg by mouth as needed.     ondansetron (ZOFRAN-ODT) 4 MG disintegrating  tablet Take 1 tablet (4 mg total) by mouth every 8 (eight) hours as needed for nausea or vomiting. 12 tablet 0   pantoprazole (PROTONIX) 40 MG tablet TAKE 1 TABLET BY MOUTH DAILY 90 tablet 3   zaleplon (SONATA) 5 MG capsule TAKE 1 CAPSULE BY MOUTH AT BEDTIME 30 capsule 0   No current facility-administered medications on file prior to visit.    No Known Allergies  Past Medical History:  Diagnosis Date   Essential (primary) hypertension 08/14/2014   Last Assessment & Plan:  Blood pressure has been controlled without significant dizzyness or associated fatigue. Taking antihypertensives as directed without difficulty.     HLD (hyperlipidemia) 08/14/2014   Last Assessment & Plan:  Has been working on a low fat diet and no myalgia's are present.     Recurrent UTI    Restless leg 08/14/2014   Last Assessment & Plan:  Gabapentin and Remus Loffler are controlling symptoms reasonably well.      Past Surgical History:  Procedure Laterality Date   ABDOMINOPLASTY  1997   BREAST CYST EXCISION Right 1960   COLONOSCOPY WITH PROPOFOL N/A 09/15/2015   Procedure: COLONOSCOPY WITH PROPOFOL;  Surgeon: Elnita Maxwell, MD;  Location: [facility] ENDOSCOPY;  Service: Endoscopy;  Laterality: N/A;   TONSILLECTOMY  1947    Family History  Problem Relation Age of Onset   Bladder Cancer Mother    Breast cancer Maternal Grandmother 12       Unsure   Prostate cancer Neg  Hx    Kidney cancer Neg Hx     Social History   Socioeconomic History   Marital status: Married    Spouse name: Not on file   Number of children: Not on file   Years of education: Not on file   Highest education level: Not on file  Occupational History   Occupation: Real estate    Comment: retired   Occupation: Neurosurgeon and Engineer, agricultural    Comment: retired  Tobacco Use   Smoking status: Former    Passive exposure: Never   Smokeless tobacco: Never  Substance and Sexual Activity   Alcohol use: Yes    Alcohol/week: 0.0 standard  drinks of alcohol   Drug use: No   Sexual activity: Not on file  Other Topics Concern   Not on file  Social History Narrative   Lost 1 son   1 son living      Has living will   Husband then son are [facility] POA   Would accept resuscitation    No prolonged tube feeds      Donated body to [employer]   Social Determinants of Health   Financial Resource Strain: Not on file  Food Insecurity: Not on file  Transportation Needs: Not on file  Physical Activity: Not on file  Stress: Not on file  Social Connections: Not on file  Intimate Partner Violence: Not on file   Review of Systems Appetite is fine Weight stable Taste buds still not right since COVID. No change Doesn't sleep well. Still uses the gabapentin. Uses sonata twice a week at most Wears seat belt Teeth are okay---keeps up with dentist No urinary issues--uses the macrodantin for UTI symptoms No other back or joint problems--some left knee arthritis    Objective:   Physical Exam Constitutional:      Appearance: Normal appearance.  HENT:     Mouth/Throat:     Pharynx: No oropharyngeal exudate or posterior oropharyngeal erythema.  Eyes:     Conjunctiva/sclera: Conjunctivae normal.     Pupils: Pupils are equal, round, and reactive to light.  Cardiovascular:     Rate and Rhythm: Normal rate and regular rhythm.     Pulses: Normal pulses.     Heart sounds: No murmur heard.    No gallop.  Pulmonary:     Effort: Pulmonary effort is normal.     Breath sounds: Normal breath sounds. No wheezing or rales.  Abdominal:     Palpations: Abdomen is soft.     Tenderness: There is no abdominal tenderness.  Musculoskeletal:     Cervical back: Neck supple.     Right lower leg: No edema.     Left lower leg: No edema.     Comments: No specific CMC tenderness. Does have tenderness along distal forearm (discussed tendonitis--cool compresses, topical diclofenac)  Lymphadenopathy:     Cervical: No cervical adenopathy.  Skin:    Findings:  No rash.  Neurological:     General: No focal deficit present.     Mental Status: She is alert and oriented to person, place, and time.     Comments: Word naming---14/1 minute Recall 3/3  Psychiatric:        Mood and Affect: Mood normal.        Behavior: Behavior normal.            Assessment & Plan:

## 2023-05-17 NOTE — Progress Notes (Signed)
Hearing Screening - Comments:: Passed whisper test Vision Screening - Comments:: September 2024

## 2023-05-17 NOTE — Assessment & Plan Note (Signed)
Doesn't want bisphosphonates Exercise---calcium--vitamin D

## 2023-05-17 NOTE — Assessment & Plan Note (Signed)
Persistent throat symptoms at night on every other day pantoprazole Will have her take it daily

## 2023-05-17 NOTE — Addendum Note (Signed)
Addended by: Eual Fines on: 05/17/2023 10:42 AM   Modules accepted: Orders

## 2023-05-17 NOTE — Assessment & Plan Note (Signed)
Controlled with the support hose

## 2023-05-17 NOTE — Assessment & Plan Note (Addendum)
I have personally reviewed the Medicare Annual Wellness questionnaire and have noted 1. The patient's medical and social history 2. Their use of alcohol, tobacco or illicit drugs 3. Their current medications and supplements 4. The patient's functional ability including ADL's, fall risks, home safety risks and hearing or visual             impairment. 5. Diet and physical activities 6. Evidence for depression or mood disorders  The patients weight, height, BMI and visual acuity have been recorded in the chart I have made referrals, counseling and provided education to the patient based review of the above and I have provided the pt with a written personalized care plan for preventive services.  I have provided you with a copy of your personalized plan for preventive services. Please take the time to review along with your updated medication list.  Regular exercise Done with cancer screening Had flu and updated COVID vaccines RSV at the pharmacy Prevnar 20 today

## 2023-05-18 ENCOUNTER — Telehealth: Payer: Self-pay

## 2023-05-18 ENCOUNTER — Other Ambulatory Visit: Payer: Self-pay | Admitting: Internal Medicine

## 2023-05-18 DIAGNOSIS — E875 Hyperkalemia: Secondary | ICD-10-CM

## 2023-05-18 LAB — PARATHYROID HORMONE, INTACT (NO CA): PTH: 43 pg/mL (ref 16–77)

## 2023-05-18 NOTE — Telephone Encounter (Signed)
Left message for pt to call office.  The labs are fine except for a very high vitamin D Make sure she knows to stop all her vitamin D and also her calcium The potassium is borderline elevated--but that is not concerning Kidney function was actually a bit better Everything else was normal Set up lab work in 2-3 weeks to recheck

## 2023-05-18 NOTE — Telephone Encounter (Signed)
See note on the lab report

## 2023-05-23 ENCOUNTER — Telehealth: Payer: Self-pay | Admitting: Internal Medicine

## 2023-05-23 MED ORDER — ZALEPLON 5 MG PO CAPS: 5.0000 mg | ORAL_CAPSULE | Freq: Every evening | ORAL | 0 refills | Status: DC | PRN

## 2023-05-23 NOTE — Telephone Encounter (Signed)
Prescription Request  05/23/2023  LOV: 05/17/2023  What is the name of the medication or equipment?  zaleplon (SONATA) 5 MG capsule  Have you contacted your pharmacy to request a refill? Yes   Which pharmacy would you like this sent to?  [facility] - [place], [place] - [address] [place] [address] Phone: [phone] Fax: [phone]    Patient notified that their request is being sent to the clinical staff for review and that they should receive a response within 2 business days.   Please advise at [facility] [phone]

## 2023-05-23 NOTE — Telephone Encounter (Signed)
Pt was advised at recent OV that the med was sent to [facility] on 05-12-23.  I called [facility] and spoke to [facility] to see if they have the medication. Said last rx they have was 01-31-23. We do not have a failed rx for it. Will forward to Dr Alphonsus Sias to send, again.

## 2023-05-31 DIAGNOSIS — Z9849 Cataract extraction status, unspecified eye: Secondary | ICD-10-CM | POA: Diagnosis not present

## 2023-05-31 DIAGNOSIS — Z85828 Personal history of other malignant neoplasm of skin: Secondary | ICD-10-CM | POA: Diagnosis not present

## 2023-05-31 DIAGNOSIS — I10 Essential (primary) hypertension: Secondary | ICD-10-CM | POA: Diagnosis not present

## 2023-05-31 DIAGNOSIS — G2581 Restless legs syndrome: Secondary | ICD-10-CM | POA: Diagnosis not present

## 2023-05-31 DIAGNOSIS — Z87891 Personal history of nicotine dependence: Secondary | ICD-10-CM | POA: Diagnosis not present

## 2023-05-31 DIAGNOSIS — K219 Gastro-esophageal reflux disease without esophagitis: Secondary | ICD-10-CM | POA: Diagnosis not present

## 2023-05-31 DIAGNOSIS — M810 Age-related osteoporosis without current pathological fracture: Secondary | ICD-10-CM | POA: Diagnosis not present

## 2023-06-02 ENCOUNTER — Other Ambulatory Visit: Payer: PPO

## 2023-06-02 ENCOUNTER — Telehealth: Payer: Self-pay

## 2023-06-02 DIAGNOSIS — E875 Hyperkalemia: Secondary | ICD-10-CM | POA: Diagnosis not present

## 2023-06-02 LAB — RENAL FUNCTION PANEL
Albumin: 4.3 g/dL (ref 3.5–5.2)
BUN: 24 mg/dL — ABNORMAL HIGH (ref 6–23)
CO2: 31 meq/L (ref 19–32)
Calcium: 9.6 mg/dL (ref 8.4–10.5)
Chloride: 104 meq/L (ref 96–112)
Creatinine, Ser: 0.93 mg/dL (ref 0.40–1.20)
GFR: 56.85 mL/min — ABNORMAL LOW (ref >60.00)
Glucose, Bld: 111 mg/dL — ABNORMAL HIGH (ref 70–99)
Phosphorus: 3 mg/dL (ref 2.3–4.6)
Potassium: 4.7 meq/L (ref 3.5–5.1)
Sodium: 140 meq/L (ref 135–145)

## 2023-06-02 LAB — VITAMIN D 25 HYDROXY (VIT D DEFICIENCY, FRACTURES): VITD: >120 ng/mL

## 2023-06-02 NOTE — Telephone Encounter (Signed)
See my note on the lab--could take a while to go down depending on her fat stores---but next step would be nephrology vs rechecking in 1-2 months

## 2023-06-02 NOTE — Telephone Encounter (Signed)
Saa with [facility] lab called critical report Vitamin D > 120. Sending note to Dr Alphonsus Sias, Alphonsus Sias pool and is in lab notebook. Will teams [facility] CMA.

## 2023-06-08 ENCOUNTER — Other Ambulatory Visit: Payer: Self-pay | Admitting: Internal Medicine

## 2023-06-08 ENCOUNTER — Telehealth: Payer: Self-pay | Admitting: Internal Medicine

## 2023-06-08 DIAGNOSIS — E673 Hypervitaminosis D: Secondary | ICD-10-CM

## 2023-06-08 NOTE — Telephone Encounter (Signed)
See results notes for documentation.  No further action needed at this time.

## 2023-06-08 NOTE — Telephone Encounter (Signed)
Patent returned call regarding labs, would like a call back when possible. Please advise.

## 2023-07-05 DIAGNOSIS — S66911A Strain of unspecified muscle, fascia and tendon at wrist and hand level, right hand, initial encounter: Secondary | ICD-10-CM | POA: Diagnosis not present

## 2023-07-25 ENCOUNTER — Other Ambulatory Visit: Payer: PPO

## 2023-07-27 DIAGNOSIS — M67431 Ganglion, right wrist: Secondary | ICD-10-CM | POA: Diagnosis not present

## 2023-07-28 ENCOUNTER — Other Ambulatory Visit (INDEPENDENT_AMBULATORY_CARE_PROVIDER_SITE_OTHER): Payer: PPO

## 2023-07-28 DIAGNOSIS — E673 Hypervitaminosis D: Secondary | ICD-10-CM | POA: Diagnosis not present

## 2023-07-28 LAB — RENAL FUNCTION PANEL
Albumin: 4.4 g/dL (ref 3.5–5.2)
BUN: 25 mg/dL — ABNORMAL HIGH (ref 6–23)
CO2: 27 meq/L (ref 19–32)
Calcium: 9.9 mg/dL (ref 8.4–10.5)
Chloride: 100 meq/L (ref 96–112)
Creatinine, Ser: 0.84 mg/dL (ref 0.40–1.20)
GFR: 64.16 mL/min (ref >60.00)
Glucose, Bld: 108 mg/dL — ABNORMAL HIGH (ref 70–99)
Phosphorus: 3.4 mg/dL (ref 2.3–4.6)
Potassium: 4.6 meq/L (ref 3.5–5.1)
Sodium: 135 meq/L (ref 135–145)

## 2023-07-28 LAB — VITAMIN D 25 HYDROXY (VIT D DEFICIENCY, FRACTURES): VITD: 93.95 ng/mL (ref 30.00–100.00)

## 2023-08-08 ENCOUNTER — Other Ambulatory Visit: Payer: Self-pay | Admitting: Internal Medicine

## 2023-09-05 DIAGNOSIS — D2271 Melanocytic nevi of right lower limb, including hip: Secondary | ICD-10-CM | POA: Diagnosis not present

## 2023-09-05 DIAGNOSIS — Z85828 Personal history of other malignant neoplasm of skin: Secondary | ICD-10-CM | POA: Diagnosis not present

## 2023-09-05 DIAGNOSIS — D2262 Melanocytic nevi of left upper limb, including shoulder: Secondary | ICD-10-CM | POA: Diagnosis not present

## 2023-09-05 DIAGNOSIS — D2272 Melanocytic nevi of left lower limb, including hip: Secondary | ICD-10-CM | POA: Diagnosis not present

## 2023-09-05 DIAGNOSIS — L821 Other seborrheic keratosis: Secondary | ICD-10-CM | POA: Diagnosis not present

## 2023-09-05 DIAGNOSIS — Z08 Encounter for follow-up examination after completed treatment for malignant neoplasm: Secondary | ICD-10-CM | POA: Diagnosis not present

## 2023-09-05 DIAGNOSIS — D2261 Melanocytic nevi of right upper limb, including shoulder: Secondary | ICD-10-CM | POA: Diagnosis not present

## 2023-09-05 DIAGNOSIS — D225 Melanocytic nevi of trunk: Secondary | ICD-10-CM | POA: Diagnosis not present

## 2023-09-21 ENCOUNTER — Other Ambulatory Visit: Payer: Self-pay | Admitting: Internal Medicine

## 2023-09-21 NOTE — Telephone Encounter (Signed)
 Last filled 05-23-23 #30 Last OV 05-17-23 No Future OV [facility]

## 2023-09-28 ENCOUNTER — Ambulatory Visit: Payer: PPO | Admitting: Student

## 2023-09-28 ENCOUNTER — Encounter: Payer: Self-pay | Admitting: Student

## 2023-09-28 VITALS — BP 130/66 | HR 67 | Temp 97.5°F | Ht <= 58 in | Wt 120.0 lb

## 2023-09-28 DIAGNOSIS — G479 Sleep disorder, unspecified: Secondary | ICD-10-CM | POA: Diagnosis not present

## 2023-09-28 DIAGNOSIS — E2839 Other primary ovarian failure: Secondary | ICD-10-CM | POA: Diagnosis not present

## 2023-09-28 DIAGNOSIS — N1831 Chronic kidney disease, stage 3a: Secondary | ICD-10-CM | POA: Diagnosis not present

## 2023-09-28 DIAGNOSIS — I10 Essential (primary) hypertension: Secondary | ICD-10-CM

## 2023-09-28 DIAGNOSIS — G2581 Restless legs syndrome: Secondary | ICD-10-CM

## 2023-09-28 DIAGNOSIS — M810 Age-related osteoporosis without current pathological fracture: Secondary | ICD-10-CM

## 2023-09-28 DIAGNOSIS — K219 Gastro-esophageal reflux disease without esophagitis: Secondary | ICD-10-CM

## 2023-09-28 NOTE — Patient Instructions (Signed)
 VISIT SUMMARY:  Today, we discussed several ongoing health concerns, including a persistent bad taste in your mouth, throat burning after consuming red wine, a sensation in your leg, and intermittent discomfort in your right hip. We also reviewed your recent A1c levels and discussed your bone health.  YOUR PLAN:  -GASTROESOPHAGEAL REFLUX DISEASE (GERD): GERD is a condition where stomach acid frequently flows back into the tube connecting your mouth and stomach, causing irritation. To help manage your symptoms, we will increase your Protonix dosage to daily. Avoiding alcohol and carbonated beverages may also help reduce symptoms.  -DYSGEUSIA: Dysgeusia is a condition that causes a persistent bad taste in the mouth. This may be a lingering effect of your COVID-19 infection. There is no specific treatment, but sucking on lemons may provide temporary relief.  -MERALGIA PARESTHETICA: Meralgia Paresthetica is a condition characterized by tingling, numbness, and burning pain in the outer thigh. This may be related to nerve issues and elevated blood sugar levels. We will order an A1c test to check your blood sugar control and provide exercises to loosen your buttock muscles.  -HIP DISCOMFORT: The intermittent discomfort in your right hip may be due to muscle tightness, particularly in the piriformis muscle, which can affect the sciatic nerve. We will provide exercises to stretch and strengthen your buttock muscles to help alleviate this discomfort.  -GENERAL HEALTH MAINTENANCE: We discussed your bone health, noting that your vitamin D levels are normal and there is no current need for calcium supplements. We will order a DEXA scan to assess your bone density.  INSTRUCTIONS:  Please follow up with the scheduled blood work on Monday at 7:30 AM. We will discuss the lab results in person at your next appointment. Additionally, we will coordinate an appointment for your DEXA scan to assess bone density.

## 2023-09-28 NOTE — Progress Notes (Unsigned)
 [facility]: [facility] Provider: Sydnee Cabal  Code Status: Full Code Goals of Care:     09/28/2023    3:01 PM  Advanced Directives  Does Patient Have a Medical Advance Directive? Yes  Type of Estate agent of [place];Out of facility DNR (pink MOST or yellow form);Living will  Does patient want to make changes to medical advance directive? No - Patient declined  Copy of Healthcare Power of Attorney in Chart? Yes - validated most recent copy scanned in chart (See row information)     Chief Complaint  Patient presents with   Establish Care    Establish Care. Need for Dexascan.    HPI: Patient is a 83 y.o. female seen today for medical management of chronic diseases.   Discussed the use of AI scribe software for clinical note transcription with the patient, who gave verbal consent to proceed.  History of Present Illness   The patient is an 83 year old who presents with persistent bad taste and throat burning post-COVID infection.  She has experienced a persistent bad taste in her mouth since contracting COVID two years ago. She describes her taste buds as 'wonky' and often sucks on lemons to alleviate the taste. This symptom occurs without any identifiable trigger, such as specific foods.  She experiences a burning sensation in her throat after consuming certain red wines like Pinot Noir or Merlot, which she enjoys. This burning occurs after drinking and not during, and she notes that white wines do not cause this issue. She has been taking Protonix every other day since October.  She describes a sensation in her leg, likened to 'fireworks going off,' occurring at night when lying down. This started three to four months ago. She applies moisturizer to the area, which sometimes alleviates the sensation. She has discussed this with her dermatologist, who advised continuing the use of cream.  She mentions a sensation in her right hip, which does not  cause pain but feels unusual. This sensation is intermittent, occurring every few days, and is located in a specific area of the hip. She engages in physical activities such as using a treadmill and leg press, but does not perform stretching exercises.  Her last A1c was 5.8, which is higher than normal, and she acknowledges the need for rechecking it. She has a history of elevated calcium levels, which led to discontinuation of calcium supplements. Her vitamin D levels have been normal.         Past Medical History:  Diagnosis Date   Essential (primary) hypertension 08/14/2014   Last Assessment & Plan:  Blood pressure has been controlled without significant dizzyness or associated fatigue. Taking antihypertensives as directed without difficulty.     HLD (hyperlipidemia) 08/14/2014   Last Assessment & Plan:  Has been working on a low fat diet and no myalgia's are present.     Recurrent UTI    Restless leg 08/14/2014   Last Assessment & Plan:  Gabapentin and Remus Loffler are controlling symptoms reasonably well.      Past Surgical History:  Procedure Laterality Date   ABDOMINOPLASTY  1997   BREAST CYST EXCISION Right 1960   COLONOSCOPY WITH PROPOFOL N/A 09/15/2015   Procedure: COLONOSCOPY WITH PROPOFOL;  Surgeon: Elnita Maxwell, MD;  Location: [facility] ENDOSCOPY;  Service: Endoscopy;  Laterality: N/A;   TONSILLECTOMY  1947    No Known Allergies  Outpatient Encounter Medications as of 09/28/2023  Medication Sig   amLODipine (  NORVASC) 5 MG tablet TAKE 1 TABLET BY MOUTH ONCE DAILY.   Cholecalciferol (VITAMIN D-3) 125 MCG (5000 UT) TABS Take 1 tablet by mouth daily.   Cyanocobalamin (B-12) 5000 MCG CAPS Take by mouth.   gabapentin (NEURONTIN) 300 MG capsule TAKE 1 TO 2 CAPSULES (300-600 MG) BY MOUTH AT BEDTIME FOR RESTLESS LEGS   losartan (COZAAR) 100 MG tablet TAKE 1 TABLET BY MOUTH DAILY   Magnesium 250 MG TABS Take 480 mg by mouth daily.   Multiple Vitamin (MULTIVITAMIN) capsule Take 1  capsule by mouth daily.   nitrofurantoin (MACRODANTIN) 50 MG capsule Take 50 mg by mouth as needed.   pantoprazole (PROTONIX) 40 MG tablet TAKE 1 TABLET BY MOUTH DAILY   zaleplon (SONATA) 5 MG capsule TAKE 1 CAPSULE BY MOUTH AT BEDTIME AS NEEDED FOR SLEEP.   [DISCONTINUED] calcium carbonate (OS-CAL - DOSED IN MG OF ELEMENTAL CALCIUM) 1250 (500 Ca) MG tablet Take 1 tablet by mouth daily with breakfast. (Patient not taking: Reported on 09/28/2023)   [DISCONTINUED] Cholecalciferol 50 MCG (2000 UT) TABS Take by mouth daily. (Patient not taking: Reported on 09/28/2023)   [DISCONTINUED] ondansetron (ZOFRAN-ODT) 4 MG disintegrating tablet Take 1 tablet (4 mg total) by mouth every 8 (eight) hours as needed for nausea or vomiting. (Patient not taking: Reported on 09/28/2023)   No facility-administered encounter medications on file as of 09/28/2023.    Review of Systems:  Review of Systems  Health Maintenance  Topic Date Due   DEXA SCAN  Never done   COVID-19 Vaccine (7 - 2024-25 season) 10/11/2024 (Originally 03/20/2023)   Medicare Annual Wellness (AWV)  05/16/2024   DTaP/Tdap/Td (2 - Td or Tdap) 06/24/2032   Pneumonia Vaccine 22+ Years old  Completed   INFLUENZA VACCINE  Completed   Zoster Vaccines- Shingrix  Completed   HPV VACCINES  Aged Out    Physical Exam: Vitals:   09/28/23 1456  BP: 130/66  Pulse: 67  Temp: (!) 97.5 F (36.4 C)  SpO2: 99%  Weight: 120 lb (54.4 kg)  Height: 4\' 10"  (1.473 m)   Body mass index is 25.08 kg/m. Physical Exam Physical Exam   MUSCULOSKELETAL: Muscles strong, good density.      Labs reviewed: Basic Metabolic Panel: Recent Labs    05/17/23 1033 06/02/23 0949 07/28/23 1052  NA 140 140 135  K 5.4* 4.7 4.6  CL 103 104 100  CO2 30 31 27   GLUCOSE 97 111* 108*  BUN 19 24* 25*  CREATININE 0.97 0.93 0.84  CALCIUM 10.2 9.6 9.9  PHOS 3.3 3.0 3.4   Liver Function Tests: Recent Labs    05/17/23 1033 06/02/23 0949 07/28/23 1052  AST 27  --   --    ALT 15  --   --   ALKPHOS 92  --   --   BILITOT 0.8  --   --   PROT 6.8  --   --   ALBUMIN 4.4  4.4 4.3 4.4   No results for input(s): "LIPASE", "AMYLASE" in the last 8760 hours. No results for input(s): "AMMONIA" in the last 8760 hours. CBC: Recent Labs    05/17/23 1033  WBC 6.2  HGB 13.2  HCT 41.0  MCV 90.5  PLT 246.0   Lipid Panel: No results for input(s): "CHOL", "HDL", "LDLCALC", "TRIG", "CHOLHDL", "LDLDIRECT" in the last 8760 hours. Lab Results  Component Value Date   HGBA1C 5.8 (H) 11/24/2020    Procedures since last visit: No results found. Results   LABS  A1c: 5.8 Calcium: 10.2      Assessment/Plan Gastroesophageal Reflux Disease (GERD) Burning sensation in the throat after consuming red wine, likely due to alcohol relaxing the lower esophageal sphincter and exacerbating acid reflux. She is currently taking Protonix every other day, but symptoms suggest a need for daily dosing. Alcohol and carbonated beverages like Coke may worsen symptoms by relaxing the esophageal sphincter. - Increase Protonix to daily dosing  Dysgeusia Intermittent bad taste in the mouth persisting since COVID-19 infection two years ago. She reports needing to suck on lemons to alleviate the taste. The etiology is unclear, but it is suspected to be a lingering effect of COVID-19.  Meralgia Paresthetica Intermittent pins and needles sensation in the thigh, occurring at night and relieved by applying moisturizer. Symptoms started 3-4 months ago. Differential includes nerve-related issues possibly exacerbated by elevated blood sugars. She has elevated A1c (5.8). - Order A1c test to assess blood sugar control - Provide exercises to loosen buttock muscles  Piriformis Syndrome Intermittent discomfort in the right hip, not consistently present. She engages in regular exercise but does not stretch. The discomfort may be related to muscle tightness, particularly in the piriformis muscle, affecting  the sciatic nerve. Exercises to stretch and strengthen buttock muscles may alleviate symptoms. - Provide exercises to stretch and strengthen buttock muscles  General Health Maintenance Discussion of bone health and previous calcium and vitamin D supplementation. Her vitamin D levels have been normal, and there is no current indication for calcium supplementation. A DEXA scan is recommended to assess bone density. - Order DEXA scan for bone density assessment  Follow-up Plan for follow-up blood work and DEXA scan. Blood work is scheduled for Monday at 7:30 AM, and results will be discussed in person at the next appointment. - Schedule blood work for Monday at 7:30 AM - Discuss lab results in person at the next appointment - Coordinate DEXA scan appointment          Labs/tests ordered:  * No order type specified * Next appt:  Visit date not found

## 2023-09-29 ENCOUNTER — Encounter: Payer: Self-pay | Admitting: Student

## 2023-10-06 DIAGNOSIS — G479 Sleep disorder, unspecified: Secondary | ICD-10-CM | POA: Diagnosis not present

## 2023-10-06 DIAGNOSIS — K219 Gastro-esophageal reflux disease without esophagitis: Secondary | ICD-10-CM | POA: Diagnosis not present

## 2023-10-06 DIAGNOSIS — N1831 Chronic kidney disease, stage 3a: Secondary | ICD-10-CM | POA: Diagnosis not present

## 2023-10-06 DIAGNOSIS — M810 Age-related osteoporosis without current pathological fracture: Secondary | ICD-10-CM | POA: Diagnosis not present

## 2023-10-06 DIAGNOSIS — I10 Essential (primary) hypertension: Secondary | ICD-10-CM | POA: Diagnosis not present

## 2023-10-06 DIAGNOSIS — G2581 Restless legs syndrome: Secondary | ICD-10-CM | POA: Diagnosis not present

## 2023-10-06 DIAGNOSIS — E2839 Other primary ovarian failure: Secondary | ICD-10-CM | POA: Diagnosis not present

## 2023-10-06 DIAGNOSIS — R739 Hyperglycemia, unspecified: Secondary | ICD-10-CM | POA: Diagnosis not present

## 2023-10-07 LAB — LIPID PANEL
Cholesterol: 172 mg/dL (ref <200)
HDL: 67 mg/dL (ref >=50)
LDL Cholesterol (Calc): 88 mg/dL
Non-HDL Cholesterol (Calc): 105 mg/dL (ref <130)
Total CHOL/HDL Ratio: 2.6 (calc) (ref <5.0)
Triglycerides: 78 mg/dL (ref <150)

## 2023-10-07 LAB — CBC WITH DIFFERENTIAL/PLATELET
Absolute Lymphocytes: 2198 {cells}/uL (ref 850–3900)
Absolute Monocytes: 607 {cells}/uL (ref 200–950)
Basophils Absolute: 31 {cells}/uL (ref 0–200)
Basophils Relative: 0.6 %
Eosinophils Absolute: 10 {cells}/uL — ABNORMAL LOW (ref 15–500)
Eosinophils Relative: 0.2 %
HCT: 39.1 % (ref 35.0–45.0)
Hemoglobin: 12.8 g/dL (ref 11.7–15.5)
MCH: 29.5 pg (ref 27.0–33.0)
MCHC: 32.7 g/dL (ref 32.0–36.0)
MCV: 90.1 fL (ref 80.0–100.0)
MPV: 9.8 fL (ref 7.5–12.5)
Monocytes Relative: 11.9 %
Neutro Abs: 2254 {cells}/uL (ref 1500–7800)
Neutrophils Relative %: 44.2 %
Platelets: 277 10*3/uL (ref 140–400)
RBC: 4.34 10*6/uL (ref 3.80–5.10)
RDW: 12.8 % (ref 11.0–15.0)
Total Lymphocyte: 43.1 %
WBC: 5.1 10*3/uL (ref 3.8–10.8)

## 2023-10-07 LAB — VITAMIN D 25 HYDROXY (VIT D DEFICIENCY, FRACTURES): Vit D, 25-Hydroxy: 87 ng/mL (ref 30–100)

## 2023-10-07 LAB — COMPREHENSIVE METABOLIC PANEL
AG Ratio: 1.7 (calc) (ref 1.0–2.5)
ALT: 11 U/L (ref 6–29)
AST: 24 U/L (ref 10–35)
Albumin: 4.3 g/dL (ref 3.6–5.1)
Alkaline phosphatase (APISO): 94 U/L (ref 37–153)
BUN/Creatinine Ratio: 21 (calc) (ref 6–22)
BUN: 25 mg/dL (ref 7–25)
CO2: 28 mmol/L (ref 20–32)
Calcium: 9.9 mg/dL (ref 8.6–10.4)
Chloride: 101 mmol/L (ref 98–110)
Creat: 1.18 mg/dL — ABNORMAL HIGH (ref 0.60–0.95)
Globulin: 2.5 g/dL (ref 1.9–3.7)
Glucose, Bld: 72 mg/dL (ref 65–99)
Potassium: 4.3 mmol/L (ref 3.5–5.3)
Sodium: 138 mmol/L (ref 135–146)
Total Bilirubin: 0.6 mg/dL (ref 0.2–1.2)
Total Protein: 6.8 g/dL (ref 6.1–8.1)
eGFR: 46 mL/min/{1.73_m2} — ABNORMAL LOW (ref >=60)

## 2023-10-07 LAB — HEMOGLOBIN A1C
Hgb A1c MFr Bld: 5.9 %{Hb} — ABNORMAL HIGH (ref <5.7)
Mean Plasma Glucose: 123 mg/dL
eAG (mmol/L): 6.8 mmol/L

## 2023-10-07 LAB — VITAMIN B12: Vitamin B-12: >2000 pg/mL — ABNORMAL HIGH (ref 200–1100)

## 2023-10-07 LAB — TSH: TSH: 1.64 m[IU]/L (ref 0.40–4.50)

## 2023-10-12 ENCOUNTER — Encounter: Payer: Self-pay | Admitting: Student

## 2023-11-08 ENCOUNTER — Ambulatory Visit
Admission: RE | Admit: 2023-11-08 | Discharge: 2023-11-08 | Disposition: A | Discharge status: Home / Self Care | Source: Ambulatory Visit | Attending: Student | Admitting: Student

## 2023-11-08 DIAGNOSIS — E2839 Other primary ovarian failure: Secondary | ICD-10-CM | POA: Diagnosis not present

## 2023-11-08 DIAGNOSIS — M810 Age-related osteoporosis without current pathological fracture: Secondary | ICD-10-CM | POA: Diagnosis not present

## 2023-11-08 DIAGNOSIS — Z780 Asymptomatic menopausal state: Secondary | ICD-10-CM | POA: Diagnosis not present

## 2023-11-12 ENCOUNTER — Encounter: Payer: Self-pay | Admitting: Student

## 2023-11-18 ENCOUNTER — Other Ambulatory Visit: Payer: Self-pay | Admitting: Student

## 2023-11-23 ENCOUNTER — Encounter (HOSPITAL_COMMUNITY): Payer: Self-pay

## 2023-11-30 ENCOUNTER — Ambulatory Visit: Admitting: Student

## 2023-11-30 ENCOUNTER — Encounter: Payer: Self-pay | Admitting: Student

## 2023-11-30 VITALS — BP 122/64 | HR 63 | Temp 97.3°F | Ht <= 58 in | Wt 118.0 lb

## 2023-11-30 DIAGNOSIS — N1831 Chronic kidney disease, stage 3a: Secondary | ICD-10-CM

## 2023-11-30 DIAGNOSIS — M810 Age-related osteoporosis without current pathological fracture: Secondary | ICD-10-CM | POA: Diagnosis not present

## 2023-11-30 DIAGNOSIS — I10 Essential (primary) hypertension: Secondary | ICD-10-CM

## 2023-11-30 DIAGNOSIS — G5700 Lesion of sciatic nerve, unspecified lower limb: Secondary | ICD-10-CM

## 2023-11-30 DIAGNOSIS — K219 Gastro-esophageal reflux disease without esophagitis: Secondary | ICD-10-CM | POA: Diagnosis not present

## 2023-11-30 MED ORDER — PANTOPRAZOLE SODIUM 40 MG PO TBEC: 40.0000 mg | DELAYED_RELEASE_TABLET | ORAL | 1 refills | Status: DC

## 2023-11-30 NOTE — Patient Instructions (Signed)
 VISIT SUMMARY:  Today, we discussed your ongoing management of osteopenia, osteoporosis, and gastroesophageal reflux disease (GERD). We reviewed your current medications and symptoms, and made adjustments to your treatment plan to better manage your conditions and improve your overall health.  YOUR PLAN:  -OSTEOPOROSIS OF WRIST: Osteoporosis is a condition where bones become weak and are more likely to break. To help strengthen your bones, we are starting you on vitamin D  and calcium supplements. We will repeat a bone density scan (DEXA) in 6 to 12 months to monitor your progress.  -OSTEOPENIA OF HIPS AND LOWER BACK: Osteopenia is a condition where bone density is lower than normal but not low enough to be classified as osteoporosis. To help strengthen your bones, we are starting you on vitamin D  1000-2000 international units  (25-50mcg) and calcium supplements no more than 1200 mg. Look for Caltrate.  We will repeat a bone density scan (DEXA) in 6 to 12 months to monitor your progress.  -HIP PAIN: Your hip pain has improved with the exercises you have been doing. Continue performing these exercises on both sides to maintain and further improve your hip strength and reduce pain.  -GASTROESOPHAGEAL REFLUX DISEASE (GERD): GERD is a condition where stomach acid frequently flows back into the tube connecting your mouth and stomach, causing discomfort. Your symptoms have improved with the current dosing of Protonix. However, due to concerns about long-term use affecting bone density and nutrient absorption, we will reduce the Protonix dose to every other day. If your symptoms worsen, we may need to return to daily dosing. Please monitor your symptoms and let us  know if there are any changes.  INSTRUCTIONS:  Please start taking vitamin D  and calcium supplements as discussed. Reduce your Protonix dose to every other day and monitor your symptoms. We will repeat your bone density scan (DEXA) in 6 to 12  months to check on your bone health. If your GERD symptoms worsen, contact us  to discuss possibly returning to daily Protonix. Continue your hip exercises on both sides to maintain improvement in your hip pain.

## 2023-12-01 ENCOUNTER — Encounter: Payer: Self-pay | Admitting: Student

## 2023-12-01 NOTE — Progress Notes (Signed)
 [facility] Provider: Jann Melody  Goals of Care:     11/30/2023    4:00 PM  Advanced Directives  Does Patient Have a Medical Advance Directive? Yes  Type of Estate agent of [place];Out of facility DNR (pink MOST or yellow form);Living will  Does patient want to make changes to medical advance directive? No - Patient declined  Copy of Healthcare Power of Attorney in Chart? No - copy requested     Chief Complaint  Patient presents with   Medical Management of Chronic Issues    Medical Management of Chronic Issues. 2 Month follow up    HPI: Patient is a 83 y.o. female seen today for medical management of chronic diseases.   Discussed the use of AI scribe software for clinical note transcription with the patient, who gave verbal consent to proceed.  History of Present Illness   Kathryn Love is an 83 year old female who presents for follow-up on medication management and symptom control related to osteopenia and osteoporosis.  She has been taking Protonix 40 mg daily for the past two months to manage her symptoms, which have improved. Previously, she was taking it every other day. She is concerned about potential long-term effects of the medication, such as kidney or bladder injury, and its impact on bone density and nutrient absorption. She is considering reducing the dose back to every other day.  She reports improvement in hip pain with exercises, which she performs on both sides. She has osteopenia in her hips and lower back, and osteoporosis in her wrist.  She is concerned about the possibility of acid reflux symptoms returning if the Protonix dose is reduced. Severe symptoms in the past have led to vomiting and difficulty eating, emphasizing the importance of managing her symptoms to maintain her ability to eat.         Past Medical History:  Diagnosis Date   Essential (primary) hypertension 08/14/2014   Last Assessment &  Plan:  Blood pressure has been controlled without significant dizzyness or associated fatigue. Taking antihypertensives as directed without difficulty.     HLD (hyperlipidemia) 08/14/2014   Last Assessment & Plan:  Has been working on a low fat diet and no myalgia's are present.     Recurrent UTI    Restless leg 08/14/2014   Last Assessment & Plan:  Gabapentin  and ambien are controlling symptoms reasonably well.      Past Surgical History:  Procedure Laterality Date   ABDOMINOPLASTY  1997   BREAST CYST EXCISION Right 1960   COLONOSCOPY WITH PROPOFOL  N/A 09/15/2015   Procedure: COLONOSCOPY WITH PROPOFOL ;  Surgeon: Luella Sager, MD;  Location: [facility] ENDOSCOPY;  Service: Endoscopy;  Laterality: N/A;   TONSILLECTOMY  1947    No Known Allergies  Outpatient Encounter Medications as of 11/30/2023  Medication Sig   amLODipine (NORVASC) 5 MG tablet TAKE 1 TABLET BY MOUTH ONCE DAILY.   Cholecalciferol (VITAMIN D -3) 125 MCG (5000 UT) TABS Take 1 tablet by mouth daily.   Cyanocobalamin (B-12) 5000 MCG CAPS Take by mouth.   gabapentin  (NEURONTIN ) 300 MG capsule TAKE 1 TO 2 CAPSULES (300-600 MG) BY MOUTH AT BEDTIME FOR RESTLESS LEGS   losartan (COZAAR) 100 MG tablet TAKE 1 TABLET BY MOUTH DAILY   Magnesium 250 MG TABS Take 480 mg by mouth daily.   Multiple Vitamin (MULTIVITAMIN) capsule Take 1 capsule by mouth daily.   nitrofurantoin (MACRODANTIN) 50 MG capsule Take 50  mg by mouth as needed.   zaleplon  (SONATA ) 5 MG capsule TAKE 1 CAPSULE BY MOUTH AT BEDTIME AS NEEDED FOR SLEEP.   [DISCONTINUED] pantoprazole (PROTONIX) 40 MG tablet TAKE 1 TABLET BY MOUTH DAILY   pantoprazole (PROTONIX) 40 MG tablet Take 1 tablet (40 mg total) by mouth every other day.   No facility-administered encounter medications on file as of 11/30/2023.    Review of Systems:  Review of Systems  Health Maintenance  Topic Date Due   COVID-19 Vaccine (9 - 2024-25 season) 12/23/2023   INFLUENZA VACCINE  02/17/2024    Medicare Annual Wellness (AWV)  05/16/2024   DTaP/Tdap/Td (2 - Td or Tdap) 06/24/2032   Pneumonia Vaccine 19+ Years old  Completed   DEXA SCAN  Completed   Zoster Vaccines- Shingrix  Completed   HPV VACCINES  Aged Out   Meningococcal B Vaccine  Aged Out    Physical Exam: Vitals:   11/30/23 1556  BP: 122/64  Pulse: 63  Temp: (!) 97.3 F (36.3 C)  SpO2: 96%  Weight: 118 lb (53.5 kg)  Height: 4\' 10"  (1.473 m)   Body mass index is 24.66 kg/m. Physical Exam Constitutional:      Appearance: Normal appearance.  Cardiovascular:     Rate and Rhythm: Normal rate and regular rhythm.     Pulses: Normal pulses.     Heart sounds: Normal heart sounds.  Pulmonary:     Effort: Pulmonary effort is normal.  Abdominal:     General: Abdomen is flat. Bowel sounds are normal.     Palpations: Abdomen is soft.  Musculoskeletal:        General: No swelling or tenderness.  Skin:    General: Skin is warm and dry.  Neurological:     Mental Status: She is alert and oriented to person, place, and time.     Gait: Gait normal.  Psychiatric:        Mood and Affect: Mood normal.    Physical Exam          Labs reviewed: Basic Metabolic Panel: Recent Labs    05/17/23 1033 06/02/23 0949 07/28/23 1052 10/06/23 0805  NA 140 140 135 138  K 5.4* 4.7 4.6 4.3  CL 103 104 100 101  CO2 30 31 27 28   GLUCOSE 97 111* 108* 72  BUN 19 24* 25* 25  CREATININE 0.97 0.93 0.84 1.18*  CALCIUM 10.2 9.6 9.9 9.9  PHOS 3.3 3.0 3.4  --   TSH  --   --   --  1.64   Liver Function Tests: Recent Labs    05/17/23 1033 06/02/23 0949 07/28/23 1052 10/06/23 0805  AST 27  --   --  24  ALT 15  --   --  11  ALKPHOS 92  --   --   --   BILITOT 0.8  --   --  0.6  PROT 6.8  --   --  6.8  ALBUMIN 4.4  4.4 4.3 4.4  --    No results for input(s): "LIPASE", "AMYLASE" in the last 8760 hours. No results for input(s): "AMMONIA" in the last 8760 hours. CBC: Recent Labs    05/17/23 1033 10/06/23 0805  WBC 6.2  5.1  NEUTROABS  --  2,254  HGB 13.2 12.8  HCT 41.0 39.1  MCV 90.5 90.1  PLT 246.0 277   Lipid Panel: Recent Labs    10/06/23 0805  CHOL 172  HDL 67  LDLCALC 88  TRIG 78  CHOLHDL 2.6   Lab Results  Component Value Date   HGBA1C 5.9 (H) 10/06/2023    Procedures since last visit: DG BONE DENSITY (DXA) Result Date: 11/08/2023 EXAM: DUAL X-RAY ABSORPTIOMETRY (DXA) FOR BONE MINERAL DENSITY 11/08/2023 1:47 pm CLINICAL DATA:  83 year old Female Postmenopausal. Screening for osteoporosis History of fragility fracture. TECHNIQUE: An axial (e.g., hips, spine) and/or appendicular (e.g., radius) exam was performed, as appropriate, using GE Secretary/administrator at [facility]. Images are obtained for bone mineral density measurement and are not obtained for diagnostic purposes. [id number] Exclusions: Lumbar spine due to advanced degenerative changes. COMPARISON:  None. FINDINGS: Scan quality: Good. LEFT FEMORAL NECK: BMD (in g/cm2): 0.784 T-score: -1.8 Z-score: 0.5 LEFT TOTAL HIP: BMD (in g/cm2): 0.779 T-score: -1.8 Z-score: 0.4 RIGHT FEMORAL NECK: BMD (in g/cm2): 0.795 T-score: -1.7 Z-score: 0.6 RIGHT TOTAL HIP: BMD (in g/cm2): 0.760 T-score: -2.0 Z-score: 0.2 LEFT FOREARM (RADIUS 33%): BMD (in g/cm2): 0.560 T-score: -3.6 Z-score: -0.6 FRAX 10-YEAR PROBABILITY OF FRACTURE: FRAX not reported as the lowest BMD is not in the osteopenia range. IMPRESSION: Osteoporosis based on BMD. Fracture risk is increased. Increased risk is based on low BMD and history of fragility fracture. RECOMMENDATIONS: 1. All patients should optimize calcium and vitamin D  intake. 2. Consider FDA-approved medical therapies in postmenopausal women and men aged 59 years and older, based on the following: - A hip or vertebral (clinical or morphometric) fracture - T-score less than or equal to -2.5 and secondary causes have been excluded. - Low bone mass (T-score between -1.0 and -2.5) and a 10-year probability of a  hip fracture greater than or equal to 3% or a 10-year probability of a major osteoporosis-related fracture greater than or equal to 20% based on the [place] -adapted WHO algorithm. - Clinician judgment and/or patient preferences may indicate treatment for people with 10-year fracture probabilities above or below these levels 3. Patients with diagnosis of osteoporosis or at high risk for fracture should have regular bone mineral density tests. For patients eligible for Medicare, routine testing is allowed once every 2 years. The testing frequency can be increased to one year for patients who have rapidly progressing disease, those who are receiving or discontinuing medical therapy to restore bone mass, or have additional risk factors. Electronically Signed   By: Sundra Engel M.D.   On: 11/08/2023 15:38   Results   RADIOLOGY DEXA scan: Osteopenia in hips, osteoporosis in wrist      Assessment/Plan     Osteoporosis of wrist Osteoporosis in the wrist. Initiating vitamin D  and calcium supplementation to enhance bone health. - Initiate vitamin D  and calcium supplementation. - Repeat DEXA scan in 6 to 12 months.  Osteopenia of hips and lower back Osteopenia in the hips and lower back. Initiating vitamin D  and calcium supplementation to enhance bone health. - Initiate vitamin D  and calcium supplementation. - Repeat DEXA scan in 6 to 12 months.  Hip pain Hip pain has improved with exercises. Performing exercises on both sides.  Gastroesophageal reflux disease (GERD) GERD symptoms have improved with current dosing of Protonix. Concerns about long-term use affecting bone density and nutrient absorption. Plan to reduce Protonix to every other day dosing. Discussed risk versus benefit of medication use, emphasizing the importance of maintaining adequate nutrition. If symptoms worsen with reduced dosing, consider resuming daily medication. - Decrease Protonix to every other day dosing. - Refill Protonix  prescription. - Monitor symptoms and adjust dosing if GERD symptoms worsen.  GOC: Would like to discuss Code Status at follow up appointment.     Labs/tests ordered:  * No order type specified * Next appt:  02/24/2024

## 2023-12-14 ENCOUNTER — Ambulatory Visit

## 2023-12-14 ENCOUNTER — Encounter: Payer: Self-pay | Admitting: Podiatry

## 2023-12-14 ENCOUNTER — Ambulatory Visit: Admitting: Podiatry

## 2023-12-14 DIAGNOSIS — D2372 Other benign neoplasm of skin of left lower limb, including hip: Secondary | ICD-10-CM | POA: Diagnosis not present

## 2023-12-14 DIAGNOSIS — D2371 Other benign neoplasm of skin of right lower limb, including hip: Secondary | ICD-10-CM

## 2023-12-14 NOTE — Progress Notes (Signed)
 She presents today for follow-up of her benign neoplasm to the plantar aspect Sub fifth metatarsal base left foot.  States that is doing quite well have had no problems other than some mild swelling in this left leg for which I usually wear compression hose.  Similar finding right foot.  Objective: Vital signs stable she alert oriented x3 very mild swelling in the left ankle nontender.  She has a nontender benign skin lesion Sub fifth met base left.  There is a small nucleus noted in this lesion.  There is no erythema or bursitis type sensation on palpation.  She also has 1 on the right foot however the one on the left foot has resolved considerably  Assessment: Benign skin lesion.  Swelling of the left ankle.  Benign skin lesion plantar aspect right foot  Plan: Discussed etiology pathology and surgical therapies at this point debrided the benign skin lesions for her.  Discussed that she should continue to wear her compression hose most likely venous insufficiency.

## 2024-01-04 ENCOUNTER — Other Ambulatory Visit: Payer: Self-pay | Admitting: Internal Medicine

## 2024-01-05 ENCOUNTER — Other Ambulatory Visit: Payer: Self-pay | Admitting: Student

## 2024-01-05 NOTE — Telephone Encounter (Signed)
 Patient has request refill on medication that hasn't been refilled by PCP Valrie Gehrig, MD yet. Medication pend and sent to PCP for approval.

## 2024-01-12 DIAGNOSIS — H35363 Drusen (degenerative) of macula, bilateral: Secondary | ICD-10-CM | POA: Diagnosis not present

## 2024-01-12 DIAGNOSIS — H348112 Central retinal vein occlusion, right eye, stable: Secondary | ICD-10-CM | POA: Diagnosis not present

## 2024-01-12 DIAGNOSIS — H40003 Preglaucoma, unspecified, bilateral: Secondary | ICD-10-CM | POA: Diagnosis not present

## 2024-01-12 DIAGNOSIS — Z961 Presence of intraocular lens: Secondary | ICD-10-CM | POA: Diagnosis not present

## 2024-01-26 ENCOUNTER — Other Ambulatory Visit: Payer: Self-pay | Admitting: Student

## 2024-01-26 NOTE — Telephone Encounter (Signed)
Controlled substance 

## 2024-02-24 ENCOUNTER — Encounter: Payer: Self-pay | Admitting: Student

## 2024-02-24 ENCOUNTER — Ambulatory Visit: Admitting: Student

## 2024-02-24 VITALS — BP 118/72 | HR 61 | Temp 97.1°F | Ht <= 58 in | Wt 114.8 lb

## 2024-02-24 DIAGNOSIS — G4700 Insomnia, unspecified: Secondary | ICD-10-CM

## 2024-02-24 DIAGNOSIS — M8580 Other specified disorders of bone density and structure, unspecified site: Secondary | ICD-10-CM | POA: Diagnosis not present

## 2024-02-24 DIAGNOSIS — F419 Anxiety disorder, unspecified: Secondary | ICD-10-CM

## 2024-02-24 DIAGNOSIS — M810 Age-related osteoporosis without current pathological fracture: Secondary | ICD-10-CM

## 2024-02-24 MED ORDER — CALTRATE MINIS PLUS MINERALS 300-800 MG-UNIT PO TABS: 1.0000 | ORAL_TABLET | Freq: Every day | ORAL | 3 refills | Status: AC

## 2024-02-24 MED ORDER — SERTRALINE HCL 50 MG PO TABS: ORAL_TABLET | ORAL | 3 refills | Status: DC

## 2024-02-24 NOTE — Patient Instructions (Signed)
 VISIT SUMMARY:  Today, we discussed your concerns about calcium intake, sleep disturbances, and anxiety. We reviewed your history of osteopenia, insomnia, and anxiety, and developed a plan to address each of these issues.  YOUR PLAN:  -GENERALIZED ANXIETY DISORDER: Generalized anxiety disorder is a condition characterized by excessive worry and difficulty controlling thoughts. We will start you on sertraline  (Zoloft ) at half a tablet for a few days, then increase to a full tablet after five days. We will also coordinate with a social worker for follow-up in two weeks and schedule a follow-up appointment in one month. Additionally, we may refer you to a psychiatrist for further support and discuss the potential benefits of therapy and support groups.  -INSOMNIA: Insomnia is a condition where you have trouble falling or staying asleep. We discussed sleep hygiene practices such as reducing screen time before bed, reading, and drinking chamomile tea. You should consider reducing your alcohol intake to one glass per day. Continue using Sonata  as needed, but try non-pharmacological interventions first.  -OSTEOPENIA: Osteopenia is a condition where your bones are weaker than normal but not so far gone as to be considered osteoporosis. We will start you on Caltrate minis for additional calcium supplementation to help strengthen your bones.  INSTRUCTIONS:  Please follow up with the social worker in two weeks and schedule a follow-up appointment with me in one month. Consider a referral to a psychiatrist for additional support and medication management. Continue with the sleep hygiene practices we discussed and try to reduce your alcohol intake. Start taking Caltrate minis for additional calcium supplementation.

## 2024-02-25 NOTE — Progress Notes (Signed)
 [facility] Provider: ABDUL  Code Status: Full Code Goals of Care:     02/24/2024   11:05 AM  Advanced Directives  Does Patient Have a Medical Advance Directive? Yes  Type of Estate agent of [place];Living will;Out of facility DNR (pink MOST or yellow form)  Does patient want to make changes to medical advance directive? No - Patient declined  Copy of Healthcare Power of Attorney in Chart? No - copy requested     Chief Complaint  Patient presents with   Medical Management of Chronic Issues    Medical Management of Chronic issues. 3 Month follow up    HPI: Patient is a 83 y.o. female seen today for medical management of chronic diseases.   Discussed the use of AI scribe software for clinical note transcription with the patient, who gave verbal consent to proceed.  History of Present Illness   Kathryn Love is an 83 year old female with osteopenia and insomnia who presents with concerns about calcium intake and sleep disturbances.  She has a history of osteopenia and is concerned about her calcium intake. She was taken off calcium supplements about a year ago by her previous doctor, and her highest recorded calcium level was 10.2. Currently, she consumes calcium through diet, including yogurt, milk, and green vegetables, but is not consciously trying to increase her calcium intake. She takes a woman's vitamin that provides 28% of the daily calcium requirement.  She experiences trouble sleeping and currently takes Sonata  when she cannot fall asleep by 12:30 AM. She goes to bed around 10:30-11:00 PM and sometimes takes Sonata  if she hasn't fallen asleep by 12:30 AM. She is concerned about relying on this medication nightly. She does not consume caffeine after noon and does not drink calming teas like chamomile, although she has it at home. She enjoys wine, typically two glasses after dinner, and acknowledges that alcohol does not aid in  restful sleep. She has tried melatonin without success and engages in screen time before bed, which she turns off before sleeping.  She experiences episodes of rapid heartbeat, particularly after abstaining from alcohol for several days. These episodes have been occurring intermittently for many years, with her husband recalling an incident 30-40 years ago. She has a history of panic attacks but has not taken medication for anxiety. She sometimes uses deep breathing to calm herself during these episodes.  She has a history of restless legs for which she takes gabapentin . She also takes magnesium, 200 mg at lunch and before bedtime, as part of her routine. She has experienced anxiety and difficulty controlling her thoughts, particularly following the death of her son four years ago, which has worsened over time. She listens to talk radio at night to distract herself from distressing thoughts.         Past Medical History:  Diagnosis Date   Essential (primary) hypertension 08/14/2014   Last Assessment & Plan:  Blood pressure has been controlled without significant dizzyness or associated fatigue. Taking antihypertensives as directed without difficulty.     HLD (hyperlipidemia) 08/14/2014   Last Assessment & Plan:  Has been working on a low fat diet and no myalgia's are present.     Recurrent UTI    Restless leg 08/14/2014   Last Assessment & Plan:  Gabapentin  and ambien are controlling symptoms reasonably well.      Past Surgical History:  Procedure Laterality Date   ABDOMINOPLASTY  1997   BREAST  CYST EXCISION Right 1960   COLONOSCOPY WITH PROPOFOL  N/A 09/15/2015   Procedure: COLONOSCOPY WITH PROPOFOL ;  Surgeon: Donnice Vaughn Manes, MD;  Location: [facility] ENDOSCOPY;  Service: Endoscopy;  Laterality: N/A;   TONSILLECTOMY  1947    No Known Allergies  Outpatient Encounter Medications as of 02/24/2024  Medication Sig   amLODipine (NORVASC) 5 MG tablet TAKE 1 TABLET BY MOUTH DAILY   Calcium  Carbonate-Vit D-Min (CALTRATE MINIS PLUS MINERALS) 300-800 MG-UNIT TABS Take 1 tablet by mouth daily.   Cholecalciferol (VITAMIN D -3) 125 MCG (5000 UT) TABS Take 1 tablet by mouth daily.   Cyanocobalamin  (B-12) 5000 MCG CAPS Take by mouth.   gabapentin  (NEURONTIN ) 300 MG capsule TAKE 1 TO 2 CAPSULES (300-600 MG) BY MOUTH AT BEDTIME FOR RESTLESS LEGS   losartan (COZAAR) 100 MG tablet TAKE 1 TABLET BY MOUTH DAILY   Magnesium 250 MG TABS Take 480 mg by mouth daily.   Multiple Vitamin (MULTIVITAMIN) capsule Take 1 capsule by mouth daily.   nitrofurantoin (MACRODANTIN) 50 MG capsule Take 50 mg by mouth as needed.   pantoprazole  (PROTONIX ) 40 MG tablet Take 1 tablet (40 mg total) by mouth every other day.   sertraline  (ZOLOFT ) 50 MG tablet Take 1/2 tablet for 5 days then take 1 tablet daily.   zaleplon  (SONATA ) 5 MG capsule TAKE 1 CAPSULE BY MOUTH AT BEDTIME AS NEEDED FOR SLEEP.   No facility-administered encounter medications on file as of 02/24/2024.    Review of Systems:  Review of Systems  Health Maintenance  Topic Date Due   COVID-19 Vaccine (9 - 2024-25 season) 12/23/2023   INFLUENZA VACCINE  02/17/2024   Medicare Annual Wellness (AWV)  05/16/2024   DTaP/Tdap/Td (2 - Td or Tdap) 06/24/2032   Pneumococcal Vaccine: 50+ Years  Completed   DEXA SCAN  Completed   Zoster Vaccines- Shingrix  Completed   Hepatitis B Vaccines  Aged Out   HPV VACCINES  Aged Out   Meningococcal B Vaccine  Aged Out    Physical Exam: Vitals:   02/24/24 1103  BP: 118/72  Pulse: 61  Temp: (!) 97.1 F (36.2 C)  SpO2: 98%  Weight: 114 lb 12.8 oz (52.1 kg)  Height: 4' 10 (1.473 m)   Body mass index is 23.99 kg/m. Physical Exam Constitutional:      Appearance: Normal appearance.  Cardiovascular:     Rate and Rhythm: Normal rate and regular rhythm.     Pulses: Normal pulses.     Heart sounds: Normal heart sounds.  Pulmonary:     Effort: Pulmonary effort is normal.  Abdominal:     General: Abdomen is  flat. Bowel sounds are normal.     Palpations: Abdomen is soft.  Musculoskeletal:        General: No swelling or tenderness.  Skin:    General: Skin is warm and dry.  Neurological:     Mental Status: She is alert and oriented to person, place, and time.     Gait: Gait normal.  Psychiatric:        Mood and Affect: Mood normal.     Comments: Tearful discussing son's death      Labs reviewed: Basic Metabolic Panel: Recent Labs    05/17/23 1033 06/02/23 0949 07/28/23 1052 10/06/23 0805  NA 140 140 135 138  K 5.4* 4.7 4.6 4.3  CL 103 104 100 101  CO2 30 31 27 28   GLUCOSE 97 111* 108* 72  BUN 19 24* 25* 25  CREATININE 0.97  0.93 0.84 1.18*  CALCIUM 10.2 9.6 9.9 9.9  PHOS 3.3 3.0 3.4  --   TSH  --   --   --  1.64   Liver Function Tests: Recent Labs    05/17/23 1033 06/02/23 0949 07/28/23 1052 10/06/23 0805  AST 27  --   --  24  ALT 15  --   --  11  ALKPHOS 92  --   --   --   BILITOT 0.8  --   --  0.6  PROT 6.8  --   --  6.8  ALBUMIN 4.4  4.4 4.3 4.4  --    No results for input(s): LIPASE, AMYLASE in the last 8760 hours. No results for input(s): AMMONIA in the last 8760 hours. CBC: Recent Labs    05/17/23 1033 10/06/23 0805  WBC 6.2 5.1  NEUTROABS  --  2,254  HGB 13.2 12.8  HCT 41.0 39.1  MCV 90.5 90.1  PLT 246.0 277   Lipid Panel: Recent Labs    10/06/23 0805  CHOL 172  HDL 67  LDLCALC 88  TRIG 78  CHOLHDL 2.6   Lab Results  Component Value Date   HGBA1C 5.9 (H) 10/06/2023    Procedures since last visit: No results found. Results   LABS   Calcium: 10.2 mg/dL      Assessment/Plan     Generalized anxiety disorder Longstanding anxiety exacerbated by the loss of her son four years ago. Symptoms include difficulty controlling thoughts, especially at night, and a need to block out thoughts with talk radio. No prior medication for anxiety. She is open to starting medication and therapy. - Start sertraline  (Zoloft ) at half a tablet for  a few days, then increase to a full tablet after five days. - Coordinate with Child psychotherapist for follow-up in two weeks. - Schedule follow-up appointment in one month. - Consider referral to a psychiatrist for additional support and medication management. - Discuss potential benefits of therapy and support groups.  Insomnia Chronic difficulty with sleep, often relying on Sonata  to initiate sleep. Alcohol consumption of two glasses of wine per night may affect sleep quality. Discussed sleep hygiene practices and non-pharmacological interventions. - Encourage sleep hygiene practices such as reducing screen time before bed, reading, and drinking chamomile tea. - Consider reducing alcohol intake to one glass per day. - Continue Sonata  as needed, but explore non-pharmacological interventions first.  Osteopenia Osteopenia with previous high calcium levels leading to discontinuation of calcium supplements. Current dietary calcium intake is low, primarily from yogurt and milk. She is not currently taking any calcium supplements except for a multivitamin. - Start Caltrate minis for additional calcium supplementation.        Labs/tests ordered:  * No order type specified * Next appt:  03/28/2024  I spent greater than 45 minutes for the care of this patient in face to face time, chart review, clinical documentation, patient education.

## 2024-03-14 DIAGNOSIS — Z85828 Personal history of other malignant neoplasm of skin: Secondary | ICD-10-CM | POA: Diagnosis not present

## 2024-03-14 DIAGNOSIS — L821 Other seborrheic keratosis: Secondary | ICD-10-CM | POA: Diagnosis not present

## 2024-03-14 DIAGNOSIS — D0371 Melanoma in situ of right lower limb, including hip: Secondary | ICD-10-CM | POA: Diagnosis not present

## 2024-03-14 DIAGNOSIS — D225 Melanocytic nevi of trunk: Secondary | ICD-10-CM | POA: Diagnosis not present

## 2024-03-14 DIAGNOSIS — D2271 Melanocytic nevi of right lower limb, including hip: Secondary | ICD-10-CM | POA: Diagnosis not present

## 2024-03-14 DIAGNOSIS — D2272 Melanocytic nevi of left lower limb, including hip: Secondary | ICD-10-CM | POA: Diagnosis not present

## 2024-03-14 DIAGNOSIS — Z08 Encounter for follow-up examination after completed treatment for malignant neoplasm: Secondary | ICD-10-CM | POA: Diagnosis not present

## 2024-03-14 DIAGNOSIS — L2989 Other pruritus: Secondary | ICD-10-CM | POA: Diagnosis not present

## 2024-03-14 DIAGNOSIS — D485 Neoplasm of uncertain behavior of skin: Secondary | ICD-10-CM | POA: Diagnosis not present

## 2024-03-14 DIAGNOSIS — D2262 Melanocytic nevi of left upper limb, including shoulder: Secondary | ICD-10-CM | POA: Diagnosis not present

## 2024-03-14 DIAGNOSIS — L820 Inflamed seborrheic keratosis: Secondary | ICD-10-CM | POA: Diagnosis not present

## 2024-03-14 DIAGNOSIS — D2261 Melanocytic nevi of right upper limb, including shoulder: Secondary | ICD-10-CM | POA: Diagnosis not present

## 2024-03-28 ENCOUNTER — Encounter: Admitting: Student

## 2024-04-02 ENCOUNTER — Other Ambulatory Visit: Payer: Self-pay | Admitting: Student

## 2024-04-03 NOTE — Telephone Encounter (Signed)
 Patient medication pend and sent to provider Harlene An, NP covering PCP Abdul Fine, MD . Patient has no contract on file and last refill dated 01/27/2024.

## 2024-04-18 DIAGNOSIS — D0371 Melanoma in situ of right lower limb, including hip: Secondary | ICD-10-CM | POA: Diagnosis not present

## 2024-05-07 ENCOUNTER — Other Ambulatory Visit: Payer: Self-pay | Admitting: *Deleted

## 2024-05-07 DIAGNOSIS — K219 Gastro-esophageal reflux disease without esophagitis: Secondary | ICD-10-CM

## 2024-05-07 MED ORDER — PANTOPRAZOLE SODIUM 40 MG PO TBEC: 40.0000 mg | DELAYED_RELEASE_TABLET | ORAL | 1 refills | Status: DC

## 2024-05-07 NOTE — Telephone Encounter (Signed)
 Pharmacy requested refill

## 2024-05-14 ENCOUNTER — Encounter: Payer: Self-pay | Admitting: Orthopedic Surgery

## 2024-05-14 ENCOUNTER — Non-Acute Institutional Stay: Admitting: Orthopedic Surgery

## 2024-05-14 VITALS — BP 124/72 | HR 63 | Temp 97.7°F | Ht <= 58 in | Wt 117.0 lb

## 2024-05-14 DIAGNOSIS — G2581 Restless legs syndrome: Secondary | ICD-10-CM | POA: Diagnosis not present

## 2024-05-14 DIAGNOSIS — I10 Essential (primary) hypertension: Secondary | ICD-10-CM | POA: Diagnosis not present

## 2024-05-14 DIAGNOSIS — R634 Abnormal weight loss: Secondary | ICD-10-CM

## 2024-05-14 DIAGNOSIS — R7303 Prediabetes: Secondary | ICD-10-CM | POA: Diagnosis not present

## 2024-05-14 DIAGNOSIS — K219 Gastro-esophageal reflux disease without esophagitis: Secondary | ICD-10-CM

## 2024-05-14 DIAGNOSIS — M810 Age-related osteoporosis without current pathological fracture: Secondary | ICD-10-CM

## 2024-05-14 DIAGNOSIS — N1831 Chronic kidney disease, stage 3a: Secondary | ICD-10-CM

## 2024-05-14 DIAGNOSIS — G4700 Insomnia, unspecified: Secondary | ICD-10-CM | POA: Diagnosis not present

## 2024-05-14 NOTE — Progress Notes (Signed)
 Location:  Other ([place])   Place of Service:  Clinic (12) Provider:  Greig FORBES Cluster, NP   Caro Harlene POUR, NP  Patient Care Team: Caro Harlene POUR, NP as PCP - General (Geriatric Medicine) Perla Evalene PARAS, MD as Consulting Physician (Cardiology)  Extended Emergency Contact Information Primary Emergency Contact: Kohles,Sid  [place] Mobile Phone: [phone] Relation: Spouse  Code Status:  Full code Goals of care: Advanced Directive information    02/24/2024   11:05 AM  Advanced Directives  Does Patient Have a Medical Advance Directive? Yes  Type of Estate Agent of [place];Living will;Out of facility DNR (pink MOST or yellow form)  Does patient want to make changes to medical advance directive? No - Patient declined  Copy of Healthcare Power of Attorney in Chart? No - copy requested     Chief Complaint  Patient presents with   Medical Management of Chronic Issues    3 month follow-up     HPI:  Pt is a 83 y.o. female seen today for medical management of chronic diseases.    Discussed the use of AI scribe software for clinical note transcription with the patient, who gave verbal consent to proceed.  History of Present Illness   Kathryn Love is an 83 year old female who presents for a routine follow-up.  She has osteoporosis, confirmed by a bone density test earlier this year with a T-score of -3.6 in the left forearm. She takes a daily calcium supplement with vitamin D3, preferring a brand not made in [place]. She engages in weight-bearing exercises, including walking and using hand weights. She experienced a near-fall incident, resulting in thumb pain managed with Tylenol.  She takes amlodipine and losartan for hypertension, with a recent blood pressure reading of 123/72 mmHg.   She takes gabapentin  at night for restless legs, which helps her sleep without causing morning drowsiness.  She uses pantoprazole  and pepcid for  indigestion, triggered by foods like pizza and taco salad.   She occasionally uses Sonata  for sleep disturbances, particularly when traveling.  She previously had sertraline  prescribed but decided not to take it, opting to manage her mood independently. No worsening of depression or anxiety and no panic attacks.   She received her flu and COVID vaccines at her pharmacy and is up to date on the Prevnar 20 vaccine.  She experiences shortness of breath when climbing stairs, and reports that it has worsened as she has aged. She eats three meals a day and tries to have healthy foods. She noted that some of her winter clothes felt tight when she got them out in the fall, but overall her clothes fit the same. No changes in vision or hearing and she sees a dermatologist biannually for skin concerns.  Her A1c was 5.9 in March, indicating prediabetes. She does not monitor her blood sugar at home.          Past Medical History:  Diagnosis Date   Essential (primary) hypertension 08/14/2014   Last Assessment & Plan:  Blood pressure has been controlled without significant dizzyness or associated fatigue. Taking antihypertensives as directed without difficulty.     HLD (hyperlipidemia) 08/14/2014   Last Assessment & Plan:  Has been working on a low fat diet and no myalgia's are present.     Recurrent UTI    Restless leg 08/14/2014   Last Assessment & Plan:  Gabapentin  and ambien are controlling symptoms reasonably well.     Past Surgical History:  Procedure Laterality Date   ABDOMINOPLASTY  1997   BREAST CYST EXCISION Right 1960   COLONOSCOPY WITH PROPOFOL  N/A 09/15/2015   Procedure: COLONOSCOPY WITH PROPOFOL ;  Surgeon: Donnice Vaughn Manes, MD;  Location: [facility] ENDOSCOPY;  Service: Endoscopy;  Laterality: N/A;   TONSILLECTOMY  1947    No Known Allergies  Outpatient Encounter Medications as of 05/14/2024  Medication Sig   amLODipine (NORVASC) 5 MG tablet TAKE 1 TABLET BY MOUTH DAILY   Calcium  Carbonate-Vit D-Min (CALTRATE MINIS PLUS MINERALS) 300-800 MG-UNIT TABS Take 1 tablet by mouth daily.   Cholecalciferol (VITAMIN D -3) 125 MCG (5000 UT) TABS Take 1 tablet by mouth daily.   Cyanocobalamin  (B-12) 5000 MCG CAPS Take by mouth.   gabapentin  (NEURONTIN ) 300 MG capsule TAKE 1 TO 2 CAPSULES (300-600 MG) BY MOUTH AT BEDTIME FOR RESTLESS LEGS   losartan (COZAAR) 100 MG tablet TAKE 1 TABLET BY MOUTH DAILY   Magnesium 250 MG TABS Take 480 mg by mouth daily.   Multiple Vitamin (MULTIVITAMIN) capsule Take 1 capsule by mouth daily.   nitrofurantoin (MACRODANTIN) 50 MG capsule Take 50 mg by mouth as needed.   pantoprazole  (PROTONIX ) 40 MG tablet Take 1 tablet (40 mg total) by mouth every other day.   sertraline  (ZOLOFT ) 50 MG tablet Take 1/2 tablet for 5 days then take 1 tablet daily.   zaleplon  (SONATA ) 5 MG capsule TAKE 1 CAPSULE BY MOUTH AT BEDTIME AS NEEDED FOR SLEEP.   No facility-administered encounter medications on file as of 05/14/2024.    Review of Systems  Constitutional:  Negative for fatigue and fever.  HENT:  Negative for sore throat and trouble swallowing.   Eyes:  Negative for visual disturbance.  Respiratory:  Negative for cough and shortness of breath.   Cardiovascular:  Negative for chest pain and leg swelling.  Gastrointestinal:  Negative for abdominal distention and abdominal pain.  Genitourinary:  Negative for dysuria and hematuria.  Musculoskeletal:  Positive for arthralgias and gait problem.  Skin:  Negative for wound.  Neurological:  Positive for weakness. Negative for dizziness and headaches.  Psychiatric/Behavioral:  Positive for sleep disturbance. Negative for confusion and dysphoric mood. The patient is not nervous/anxious.     Immunization History  Administered Date(s) Administered   INFLUENZA, HIGH DOSE SEASONAL PF 03/25/2017, 03/17/2018, 04/11/2019, 03/31/2020, 04/10/2024   Influenza-Unspecified 04/20/2016, 03/19/2021, 04/18/2022, 04/08/2023   Moderna  Covid-19 Fall Seasonal Vaccine 8yrs & older 05/28/2022, 04/10/2024   Moderna Covid-19 Vaccine Bivalent Booster 66yrs & up 12/15/2021   Moderna Sars-Covid-2 Vaccination 08/02/2019, 08/30/2019, 06/03/2020, 12/04/2020   PNEUMOCOCCAL CONJUGATE-20 05/17/2023   Pneumococcal Conjugate-13 08/28/2015   Respiratory Syncytial Virus Vaccine,Recomb Aduvanted(Arexvy) 06/14/2023   Tdap 06/24/2022   Unspecified SARS-COV-2 Vaccination 04/15/2023, 10/28/2023   Zoster Recombinant(Shingrix) 01/04/2018, 04/03/2018, 06/17/2018   Pertinent  Health Maintenance Due  Topic Date Due   Influenza Vaccine  Completed   DEXA SCAN  Completed      05/17/2023    8:57 AM 05/17/2023    9:19 AM 09/28/2023    3:00 PM 11/30/2023    3:59 PM 02/24/2024   11:04 AM  Fall Risk  Falls in the past year? 0 0 0 0 0  Was there an injury with Fall? 0  0 0 0  Fall Risk Category Calculator 0  0 0 0  Patient at Risk for Falls Due to No Fall Risks  No Fall Risks No Fall Risks No Fall Risks  Fall risk Follow up Falls evaluation completed  Falls evaluation completed Falls  evaluation completed Falls evaluation completed   Functional Status Survey:    Vitals:   05/14/24 1240  BP: 124/72  Pulse: 63  Temp: 97.7 F (36.5 C)  SpO2: 98%  Weight: 117 lb (53.1 kg)  Height: 4' 10 (1.473 m)   Body mass index is 24.45 kg/m. Physical Exam Vitals reviewed.  Constitutional:      General: She is not in acute distress. HENT:     Head: Normocephalic.  Eyes:     General:        Right eye: No discharge.        Left eye: No discharge.  Cardiovascular:     Rate and Rhythm: Normal rate and regular rhythm.     Pulses: Normal pulses.     Heart sounds: Normal heart sounds.  Pulmonary:     Effort: Pulmonary effort is normal.     Breath sounds: Normal breath sounds.  Abdominal:     General: Bowel sounds are normal.     Palpations: Abdomen is soft.  Musculoskeletal:     Cervical back: Neck supple.     Right lower leg: No edema.     Left  lower leg: No edema.  Skin:    General: Skin is warm.     Capillary Refill: Capillary refill takes less than 2 seconds.  Neurological:     General: No focal deficit present.     Mental Status: She is alert and oriented to person, place, and time.     Gait: Gait normal.  Psychiatric:        Mood and Affect: Mood normal.     Labs reviewed: Recent Labs    05/17/23 1033 06/02/23 0949 07/28/23 1052 10/06/23 0805  NA 140 140 135 138  K 5.4* 4.7 4.6 4.3  CL 103 104 100 101  CO2 30 31 27 28   GLUCOSE 97 111* 108* 72  BUN 19 24* 25* 25  CREATININE 0.97 0.93 0.84 1.18*  CALCIUM 10.2 9.6 9.9 9.9  PHOS 3.3 3.0 3.4  --    Recent Labs    05/17/23 1033 06/02/23 0949 07/28/23 1052 10/06/23 0805  AST 27  --   --  24  ALT 15  --   --  11  ALKPHOS 92  --   --   --   BILITOT 0.8  --   --  0.6  PROT 6.8  --   --  6.8  ALBUMIN 4.4  4.4 4.3 4.4  --    Recent Labs    05/17/23 1033 10/06/23 0805  WBC 6.2 5.1  NEUTROABS  --  2,254  HGB 13.2 12.8  HCT 41.0 39.1  MCV 90.5 90.1  PLT 246.0 277   Lab Results  Component Value Date   TSH 1.64 10/06/2023   Lab Results  Component Value Date   HGBA1C 5.9 (H) 10/06/2023   Lab Results  Component Value Date   CHOL 172 10/06/2023   HDL 67 10/06/2023   LDLCALC 88 10/06/2023   TRIG 78 10/06/2023   CHOLHDL 2.6 10/06/2023    Significant Diagnostic Results in last 30 days:  No results found.  Assessment/Plan 1. Age-related osteoporosis without current pathological fracture (Primary) - 10/2023 DEXA, t score -3.6 left forearm - cont Caltrate supplement - cont weight bearing exercises  2. Essential (primary) hypertension - controlled with amlodipine and losartan - BUN/creat 25/1.18 10/06/2023 - CBC with Differential/Platelet - Complete Metabolic Panel with eGFR - Lipid Panel  3. Stage 3a chronic kidney disease (  HCC) - GFR 46 (03/20)> was 56 (05/2023) - encourage hydration with water - avoid NSAIDS  4. Gastroesophageal  reflux disease without esophagitis - hgb 12.8  - cont pantoprazole  and pepcid - avoid food triggers  5. Restless leg - cont gabapentin    6. Insomnia, unspecified type - cont zaleplon  prn   7. Prediabetes - recent A1c was 5.9 - diet controlled  - Hemoglobin A1c  8. Weight decrease - 6 lbs weight loss in 6 months  - TSH    Family/ staff Communication: plan discussed with patient   Labs/tests ordered:  cbc/diff, cmp, A1c, lipid panel, TSH 05/17/2024

## 2024-05-14 NOTE — Patient Instructions (Addendum)
 1.) Please come to the [facility] clinic on Thursday 05/17/24 @ 7:30 for fasting labs  2.) Routine follow-up in 3 months (see below)

## 2024-05-17 DIAGNOSIS — R634 Abnormal weight loss: Secondary | ICD-10-CM | POA: Diagnosis not present

## 2024-05-17 DIAGNOSIS — I10 Essential (primary) hypertension: Secondary | ICD-10-CM | POA: Diagnosis not present

## 2024-05-17 DIAGNOSIS — R7303 Prediabetes: Secondary | ICD-10-CM | POA: Diagnosis not present

## 2024-05-17 LAB — COMPLETE METABOLIC PANEL WITHOUT GFR
AG Ratio: 1.9 (calc) (ref 1.0–2.5)
ALT: 14 U/L (ref 6–29)
AST: 24 U/L (ref 10–35)
Albumin: 4.3 g/dL (ref 3.6–5.1)
Alkaline phosphatase (APISO): 96 U/L (ref 37–153)
BUN: 18 mg/dL (ref 7–25)
CO2: 30 mmol/L (ref 20–32)
Calcium: 9.8 mg/dL (ref 8.6–10.4)
Chloride: 103 mmol/L (ref 98–110)
Creat: 0.89 mg/dL (ref 0.60–0.95)
Globulin: 2.3 g/dL (ref 1.9–3.7)
Glucose, Bld: 82 mg/dL (ref 65–99)
Potassium: 4.1 mmol/L (ref 3.5–5.3)
Sodium: 140 mmol/L (ref 135–146)
Total Bilirubin: 0.7 mg/dL (ref 0.2–1.2)
Total Protein: 6.6 g/dL (ref 6.1–8.1)

## 2024-05-17 LAB — CBC WITH DIFFERENTIAL/PLATELET
Absolute Lymphocytes: 2458 {cells}/uL (ref 850–3900)
Absolute Monocytes: 622 {cells}/uL (ref 200–950)
Basophils Absolute: 31 {cells}/uL (ref 0–200)
Basophils Relative: 0.6 %
Eosinophils Absolute: 20 {cells}/uL (ref 15–500)
Eosinophils Relative: 0.4 %
HCT: 40.2 % (ref 35.0–45.0)
Hemoglobin: 13.2 g/dL (ref 11.7–15.5)
MCH: 29.5 pg (ref 27.0–33.0)
MCHC: 32.8 g/dL (ref 32.0–36.0)
MCV: 89.9 fL (ref 80.0–100.0)
MPV: 9.8 fL (ref 7.5–12.5)
Monocytes Relative: 12.2 %
Neutro Abs: 1969 {cells}/uL (ref 1500–7800)
Neutrophils Relative %: 38.6 %
Platelets: 242 Thousand/uL (ref 140–400)
RBC: 4.47 Million/uL (ref 3.80–5.10)
RDW: 13 % (ref 11.0–15.0)
Total Lymphocyte: 48.2 %
WBC: 5.1 Thousand/uL (ref 3.8–10.8)

## 2024-05-17 LAB — LIPID PANEL
Cholesterol: 168 mg/dL (ref <200)
HDL: 74 mg/dL (ref >=50)
LDL Cholesterol (Calc): 79 mg/dL
Non-HDL Cholesterol (Calc): 94 mg/dL (ref <130)
Total CHOL/HDL Ratio: 2.3 (calc) (ref <5.0)
Triglycerides: 70 mg/dL (ref <150)

## 2024-05-17 LAB — HEMOGLOBIN A1C
Hgb A1c MFr Bld: 5.7 % — ABNORMAL HIGH (ref <5.7)
Mean Plasma Glucose: 117 mg/dL
eAG (mmol/L): 6.5 mmol/L

## 2024-05-17 LAB — TSH: TSH: 2.74 m[IU]/L (ref 0.40–4.50)

## 2024-05-18 ENCOUNTER — Ambulatory Visit: Payer: Self-pay | Admitting: Orthopedic Surgery

## 2024-06-01 ENCOUNTER — Encounter: Admitting: Orthopedic Surgery

## 2024-06-05 ENCOUNTER — Other Ambulatory Visit: Payer: Self-pay | Admitting: Orthopedic Surgery

## 2024-06-05 NOTE — Telephone Encounter (Signed)
 Patient is requesting a refill of the following medications: Requested Prescriptions   Pending Prescriptions Disp Refills   zaleplon  (SONATA ) 5 MG capsule [Pharmacy Med Name: ZALEPLON  5 MG CAP] 30 capsule     Sig: TAKE 1 CAPSULE BY MOUTH AT BEDTIME AS NEEDED FOR SLEEP.    Date of last approved: 04/03/24  Refill amount: 30  Treatment agreement date: No treatment agreement on file, notation made on pending appointment for 08/13/24

## 2024-07-16 ENCOUNTER — Encounter: Payer: Self-pay | Admitting: Orthopedic Surgery

## 2024-07-16 ENCOUNTER — Non-Acute Institutional Stay: Admitting: Orthopedic Surgery

## 2024-07-16 VITALS — BP 132/64 | HR 65 | Temp 97.6°F | Ht <= 58 in | Wt 116.0 lb

## 2024-07-16 DIAGNOSIS — R8290 Unspecified abnormal findings in urine: Secondary | ICD-10-CM | POA: Diagnosis not present

## 2024-07-16 DIAGNOSIS — Z1231 Encounter for screening mammogram for malignant neoplasm of breast: Secondary | ICD-10-CM | POA: Diagnosis not present

## 2024-07-16 NOTE — Progress Notes (Signed)
 " Location:  Other Nursing Home Room Number: [facility] Service:  Clinic ([phone]) Provider:  Greig FORBES Cluster, NP   Caro Harlene POUR, NP  Patient Care Team: Caro Harlene POUR, NP as PCP - General (Geriatric Medicine) Perla Evalene PARAS, MD as Consulting Physician (Cardiology)  Extended Emergency Contact Information Primary Emergency Contact: Raynes,Sid  [place] Mobile Phone: [phone] Relation: Spouse  Code Status:  Full code Goals of care: Advanced Directive information    02/24/2024   11:05 AM  Advanced Directives  Does Patient Have a Medical Advance Directive? Yes  Type of Estate Agent of [place];Living will;Out of facility DNR (pink MOST or yellow form)  Does patient want to make changes to medical advance directive? No - Patient declined  Copy of Healthcare Power of Attorney in Chart? No - copy requested     Chief Complaint  Patient presents with   Foul Smelling Urine    Foul Smelling Urine. No other symptoms noted. Started taking Nitrofurantoin that she had on hand but it was expired. Took 3 doses. Stopped taking it.     HPI:  Pt is a 83 y.o. female seen today for acute visit due to abnormal urine odor.   A few days ago she noted abnormal urine odor and color. She had a old prescription of nitrofurantoin and started medication, Nitrofurantoin was expired per bottle. She denies fever, malaise, dysuria, frequency and flank pain. Urine has since returned to normal color and no odor. Afebrile. Vitals stable.   Left breast with sharp pain over a week ago. Requesting orders to have yearly mammogram done. No h/o breast cancer or family history. Denies breast lump, nipple discharge or skin discoloration.      Past Medical History:  Diagnosis Date   Essential (primary) hypertension 08/14/2014   Last Assessment & Plan:  Blood pressure has been controlled without significant dizzyness or associated fatigue. Taking  antihypertensives as directed without difficulty.     HLD (hyperlipidemia) 08/14/2014   Last Assessment & Plan:  Has been working on a low fat diet and no myalgia's are present.     Recurrent UTI    Restless leg 08/14/2014   Last Assessment & Plan:  Gabapentin  and ambien are controlling symptoms reasonably well.     Past Surgical History:  Procedure Laterality Date   ABDOMINOPLASTY  1997   BREAST CYST EXCISION Right 1960   COLONOSCOPY WITH PROPOFOL  N/A 09/15/2015   Procedure: COLONOSCOPY WITH PROPOFOL ;  Surgeon: Donnice Vaughn Manes, MD;  Location: [facility] ENDOSCOPY;  Service: Endoscopy;  Laterality: N/A;   TONSILLECTOMY  1947    Allergies[1]  Outpatient Encounter Medications as of 07/16/2024  Medication Sig   amLODipine (NORVASC) 5 MG tablet TAKE 1 TABLET BY MOUTH DAILY   Calcium Carbonate-Vit D-Min (CALTRATE MINIS PLUS MINERALS) 300-800 MG-UNIT TABS Take 1 tablet by mouth daily.   Cholecalciferol (VITAMIN D -3) 125 MCG (5000 UT) TABS Take 1 tablet by mouth daily.   Cyanocobalamin  (B-12) 5000 MCG CAPS Take by mouth.   gabapentin  (NEURONTIN ) 300 MG capsule TAKE 1 TO 2 CAPSULES (300-600 MG) BY MOUTH AT BEDTIME FOR RESTLESS LEGS   losartan (COZAAR) 100 MG tablet TAKE 1 TABLET BY MOUTH DAILY   Magnesium 250 MG TABS Take 480 mg by mouth daily.   Multiple Vitamin (MULTIVITAMIN) capsule Take 1 capsule by mouth daily.   nitrofurantoin (MACRODANTIN) 50 MG capsule Take 50 mg by mouth as needed.   pantoprazole  (PROTONIX ) 40 MG tablet Take  1 tablet (40 mg total) by mouth every other day.   zaleplon  (SONATA ) 5 MG capsule TAKE 1 CAPSULE BY MOUTH AT BEDTIME AS NEEDED FOR SLEEP.   No facility-administered encounter medications on file as of 07/16/2024.    Review of Systems  Constitutional:  Negative for fatigue and fever.  Respiratory:  Negative for shortness of breath.   Cardiovascular:  Negative for chest pain.  Gastrointestinal:  Negative for abdominal pain.  Genitourinary:  Negative for dysuria,  flank pain and frequency.  Musculoskeletal:  Negative for back pain.  Psychiatric/Behavioral:  Negative for dysphoric mood. The patient is not nervous/anxious.     Immunization History  Administered Date(s) Administered   INFLUENZA, HIGH DOSE SEASONAL PF 03/25/2017, 03/17/2018, 04/11/2019, 03/31/2020, 04/10/2024   Influenza-Unspecified 04/20/2016, 03/19/2021, 04/18/2022, 04/08/2023   Moderna Covid-19 Fall Seasonal Vaccine 73yrs & older 05/28/2022, 04/10/2024   Moderna Covid-19 Vaccine Bivalent Booster 50yrs & up 12/15/2021   Moderna Sars-Covid-2 Vaccination 08/02/2019, 08/30/2019, 06/03/2020, 12/04/2020   PNEUMOCOCCAL CONJUGATE-20 05/17/2023   Pneumococcal Conjugate-13 08/28/2015   Respiratory Syncytial Virus Vaccine,Recomb Aduvanted(Arexvy) 06/14/2023   Tdap 06/24/2022   Unspecified SARS-COV-2 Vaccination 04/15/2023, 10/28/2023   Zoster Recombinant(Shingrix) 01/04/2018, 04/03/2018, 06/17/2018   Pertinent  Health Maintenance Due  Topic Date Due   Influenza Vaccine  Completed   Bone Density Scan  Completed      05/17/2023    9:19 AM 09/28/2023    3:00 PM 11/30/2023    3:59 PM 02/24/2024   11:04 AM 05/14/2024    4:17 PM  Fall Risk  Falls in the past year? 0 0 0 0 0  Was there an injury with Fall?  0  0  0  0   Fall Risk Category Calculator  0 0 0 0  Patient at Risk for Falls Due to  No Fall Risks No Fall Risks No Fall Risks History of fall(s)  Fall risk Follow up  Falls evaluation completed Falls evaluation completed Falls evaluation completed Falls evaluation completed     Data saved with a previous flowsheet row definition   Functional Status Survey:    Vitals:   07/16/24 1508  BP: 132/64  Pulse: 65  Temp: 97.6 F (36.4 C)  SpO2: 98%  Weight: 116 lb (52.6 kg)  Height: 4' 10 (1.473 m)   Body mass index is 24.24 kg/m. Physical Exam Vitals reviewed.  Constitutional:      General: She is not in acute distress. HENT:     Head: Normocephalic.  Eyes:     General:         Right eye: No discharge.        Left eye: No discharge.  Cardiovascular:     Rate and Rhythm: Normal rate and regular rhythm.     Pulses: Normal pulses.     Heart sounds: Normal heart sounds.  Pulmonary:     Effort: Pulmonary effort is normal.     Breath sounds: Normal breath sounds.  Abdominal:     Palpations: Abdomen is soft.     Tenderness: There is no right CVA tenderness or left CVA tenderness.  Musculoskeletal:        General: Normal range of motion.     Cervical back: Neck supple.  Skin:    General: Skin is warm.     Capillary Refill: Capillary refill takes less than 2 seconds.  Neurological:     General: No focal deficit present.     Mental Status: She is alert and oriented to person, place, and  time.  Psychiatric:        Mood and Affect: Mood normal.     Labs reviewed: Recent Labs    07/28/23 1052 10/06/23 0805 05/17/24 0743  NA 135 138 140  K 4.6 4.3 4.1  CL 100 101 103  CO2 27 28 30   GLUCOSE 108* 72 82  BUN 25* 25 18  CREATININE 0.84 1.18* 0.89  CALCIUM 9.9 9.9 9.8  PHOS 3.4  --   --    Recent Labs    07/28/23 1052 10/06/23 0805 05/17/24 0743  AST  --  24 24  ALT  --  11 14  BILITOT  --  0.6 0.7  PROT  --  6.8 6.6  ALBUMIN 4.4  --   --    Recent Labs    10/06/23 0805 05/17/24 0743  WBC 5.1 5.1  NEUTROABS 2,254 1,969  HGB 12.8 13.2  HCT 39.1 40.2  MCV 90.1 89.9  PLT 277 242   Lab Results  Component Value Date   TSH 2.74 05/17/2024   Lab Results  Component Value Date   HGBA1C 5.7 (H) 05/17/2024   Lab Results  Component Value Date   CHOL 168 05/17/2024   HDL 74 05/17/2024   LDLCALC 79 05/17/2024   TRIG 70 05/17/2024   CHOLHDL 2.3 05/17/2024    Significant Diagnostic Results in last 30 days:  No results found.  Assessment/Plan 1. Abnormal urine odor (Primary) - onset 2-3 days ago - took expired nitrofurantoin - asymptomatic now - exam unremarkable - advised to dispose nitrofurantoin - encourage hydration with  water    2. Encounter for screening mammogram for malignant neoplasm of breast - MM Digital Screening    Family/ staff Communication: plan discussed with patient   Labs/tests ordered:  none      [1] No Known Allergies  "

## 2024-07-16 NOTE — Patient Instructions (Signed)
 Orders to have mammogram done [facility] [phone]  Throw away nitrofurantoin  Increase hydration with water when urine had more color or odor

## 2024-07-30 ENCOUNTER — Other Ambulatory Visit: Payer: Self-pay | Admitting: Nurse Practitioner

## 2024-07-30 DIAGNOSIS — K219 Gastro-esophageal reflux disease without esophagitis: Secondary | ICD-10-CM

## 2024-08-13 ENCOUNTER — Encounter: Payer: Self-pay | Admitting: Orthopedic Surgery

## 2024-08-16 ENCOUNTER — Ambulatory Visit
Admission: RE | Admit: 2024-08-16 | Discharge: 2024-08-16 | Disposition: A | Discharge status: Home / Self Care | Source: Ambulatory Visit | Attending: Orthopedic Surgery | Admitting: Orthopedic Surgery

## 2024-08-16 DIAGNOSIS — Z1231 Encounter for screening mammogram for malignant neoplasm of breast: Secondary | ICD-10-CM | POA: Insufficient documentation

## 2024-08-17 ENCOUNTER — Non-Acute Institutional Stay: Admitting: Orthopedic Surgery

## 2024-08-17 ENCOUNTER — Encounter: Payer: Self-pay | Admitting: Orthopedic Surgery

## 2024-08-17 VITALS — BP 130/68 | HR 64 | Temp 97.1°F | Ht <= 58 in | Wt 116.0 lb

## 2024-08-17 DIAGNOSIS — N644 Mastodynia: Secondary | ICD-10-CM | POA: Diagnosis not present

## 2024-08-17 DIAGNOSIS — I10 Essential (primary) hypertension: Secondary | ICD-10-CM

## 2024-08-17 DIAGNOSIS — M810 Age-related osteoporosis without current pathological fracture: Secondary | ICD-10-CM

## 2024-08-17 DIAGNOSIS — R7303 Prediabetes: Secondary | ICD-10-CM | POA: Diagnosis not present

## 2024-08-17 DIAGNOSIS — G479 Sleep disorder, unspecified: Secondary | ICD-10-CM | POA: Diagnosis not present

## 2024-08-17 NOTE — Progress Notes (Signed)
 " Location:  Other Nursing Home Room Number: [facility] Place of Service:  Clinic [phone]) Provider:  Greig FORBES Cluster, NP   Caro Harlene POUR, NP  Patient Care Team: Caro Harlene POUR, NP as PCP - General (Geriatric Medicine) Perla Evalene PARAS, MD as Consulting Physician (Cardiology)  Extended Emergency Contact Information Primary Emergency Contact: Salvato,Sid  [place] Mobile Phone: [phone] Relation: Spouse  Code Status:  Full code  Goals of care: Advanced Directive information    08/17/2024    9:44 AM  Advanced Directives  Does Patient Have a Medical Advance Directive? Yes  Type of Estate Agent of [place];Living will  Does patient want to make changes to medical advance directive? No - Patient declined  Copy of Healthcare Power of Attorney in Chart? No - copy requested     Chief Complaint  Patient presents with   Medical Management of Chronic Issues    Medical Management of Chronic Issues. 3 Month follow up    HPI:  Pt is a 84 y.o. female seen today for medical management of chronic diseases.    Discussed the use of AI scribe software for clinical note transcription with the patient, who gave verbal consent to proceed.  History of Present Illness    She experiences sleep disturbances characterized by frequent nocturnal awakenings to urinate every two to three hours. She typically goes to bed around 10:30 to 11:00 PM and wakes up at 3:00 to 4:00 AM to urinate, but is able to return to sleep immediately. She does not consume excessive fluids in the evening and maintains hydration throughout the day. Her current evening medications include amlodipine, magnesium and gabapentin .  She has a history of osteoporosis and has experienced a weight loss of four pounds. She maintains a good appetite and consumes one and a half meals per day, including fruit and carbohydrates for breakfast, yogurt and walnuts for lunch, and meat with  vegetables and a green salad for dinner.  She experienced a single episode of stabbing pain in her left breast lasting about ten minutes, which was intense enough to cause concern. A mammogram was performed recently, and she awaits the results. She has a history of indigestion, but the pain has not recurred since the initial episode. She is taking protonix  daily.   She reports shortness of breath when climbing stairs to the attic, which she describes as normal for her, and denies any shortness of breath at rest. No chest pain, dizziness, or lightheadedness. Her bowel movements are regular, and she has not experienced any falls or accidents at home.  She sees a dermatologist every six months and reports no skin concerns. She receives pedicures every three weeks and has no changes in vision or hearing. She visited the dentist in January and reports no oral health issues.        Past Medical History:  Diagnosis Date   Essential (primary) hypertension 08/14/2014   Last Assessment & Plan:  Blood pressure has been controlled without significant dizzyness or associated fatigue. Taking antihypertensives as directed without difficulty.     HLD (hyperlipidemia) 08/14/2014   Last Assessment & Plan:  Has been working on a low fat diet and no myalgia's are present.     Recurrent UTI    Restless leg 08/14/2014   Last Assessment & Plan:  Gabapentin  and ambien are controlling symptoms reasonably well.     Past Surgical History:  Procedure Laterality Date   ABDOMINOPLASTY  1997  BREAST CYST EXCISION Right 1960   COLONOSCOPY WITH PROPOFOL  N/A 09/15/2015   Procedure: COLONOSCOPY WITH PROPOFOL ;  Surgeon: Donnice Vaughn Manes, MD;  Location[facility] ENDOSCOPY;  Service: Endoscopy;  Laterality: N/A;   TONSILLECTOMY  1947    Allergies[1]  Outpatient Encounter Medications as of 08/17/2024  Medication Sig   amLODipine (NORVASC) 5 MG tablet TAKE 1 TABLET BY MOUTH DAILY   Calcium Carbonate-Vit D-Min (CALTRATE MINIS  PLUS MINERALS) 300-800 MG-UNIT TABS Take 1 tablet by mouth daily.   Cholecalciferol (VITAMIN D -3) 125 MCG (5000 UT) TABS Take 1 tablet by mouth daily.   Cyanocobalamin  (B-12) 5000 MCG CAPS Take by mouth.   gabapentin  (NEURONTIN ) 300 MG capsule TAKE 1 TO 2 CAPSULES (300-600 MG) BY MOUTH AT BEDTIME FOR RESTLESS LEGS   losartan (COZAAR) 100 MG tablet TAKE 1 TABLET BY MOUTH DAILY   Magnesium 250 MG TABS Take 480 mg by mouth daily.   Multiple Vitamin (MULTIVITAMIN) capsule Take 1 capsule by mouth daily.   pantoprazole  (PROTONIX ) 40 MG tablet TAKE ONE TABLET EVERY OTHER DAY   zaleplon  (SONATA ) 5 MG capsule TAKE 1 CAPSULE BY MOUTH AT BEDTIME AS NEEDED FOR SLEEP.   nitrofurantoin (MACRODANTIN) 50 MG capsule Take 50 mg by mouth as needed. (Patient not taking: Reported on 08/17/2024)   No facility-administered encounter medications on file as of 08/17/2024.    Review of Systems  Constitutional:  Negative for fatigue and fever.  HENT:  Negative for sore throat and trouble swallowing.   Eyes:  Negative for visual disturbance.  Respiratory:  Negative for cough and shortness of breath.   Cardiovascular:  Negative for chest pain and leg swelling.  Gastrointestinal:  Negative for abdominal distention and abdominal pain.  Genitourinary:  Positive for frequency. Negative for dysuria.  Musculoskeletal:  Positive for gait problem.  Skin:  Negative for wound.  Neurological:  Positive for weakness. Negative for dizziness and headaches.  Psychiatric/Behavioral:  Positive for sleep disturbance. Negative for confusion and dysphoric mood. The patient is not nervous/anxious.     Immunization History  Administered Date(s) Administered   INFLUENZA, HIGH DOSE SEASONAL PF 03/25/2017, 03/17/2018, 04/11/2019, 03/31/2020, 04/10/2024   Influenza-Unspecified 04/20/2016, 03/19/2021, 04/18/2022, 04/08/2023   Moderna Covid-19 Fall Seasonal Vaccine 84yrs & older 05/28/2022, 04/10/2024   Moderna Covid-19 Vaccine Bivalent  Booster 80yrs & up 12/15/2021   Moderna Sars-Covid-2 Vaccination 08/02/2019, 08/30/2019, 06/03/2020, 12/04/2020   PNEUMOCOCCAL CONJUGATE-20 05/17/2023   Pneumococcal Conjugate-13 08/28/2015   Respiratory Syncytial Virus Vaccine,Recomb Aduvanted(Arexvy) 06/14/2023   Tdap 06/24/2022   Unspecified SARS-COV-2 Vaccination 04/15/2023, 10/28/2023   Zoster Recombinant(Shingrix) 01/04/2018, 04/03/2018, 06/17/2018   Pertinent  Health Maintenance Due  Topic Date Due   Influenza Vaccine  Completed   Bone Density Scan  Completed      11/30/2023    3:59 PM 02/24/2024   11:04 AM 05/14/2024    4:17 PM 07/16/2024    6:15 PM 08/17/2024    9:43 AM  Fall Risk  Falls in the past year? 0 0 0  0  Was there an injury with Fall? 0  0  0  0 0  Fall Risk Category Calculator 0 0 0  0  Patient at Risk for Falls Due to No Fall Risks No Fall Risks History of fall(s) No Fall Risks No Fall Risks  Fall risk Follow up Falls evaluation completed Falls evaluation completed Falls evaluation completed Falls evaluation completed Falls evaluation completed     Data saved with a previous flowsheet row definition   Functional Status Survey:  Vitals:   08/17/24 0942  BP: 130/68  Pulse: 64  Temp: (!) 97.1 F (36.2 C)  SpO2: 96%  Weight: 116 lb (52.6 kg)  Height: 4' 10 (1.473 m)   Body mass index is 24.24 kg/m. Physical Exam Vitals reviewed.  Constitutional:      General: She is not in acute distress. HENT:     Head: Normocephalic.  Eyes:     General:        Right eye: No discharge.        Left eye: No discharge.  Neck:     Thyroid : No thyroid  mass or thyromegaly.     Vascular: No carotid bruit.  Cardiovascular:     Rate and Rhythm: Normal rate and regular rhythm.     Pulses: Normal pulses.     Heart sounds: Normal heart sounds.  Pulmonary:     Effort: Pulmonary effort is normal.     Breath sounds: Normal breath sounds.  Abdominal:     General: Bowel sounds are normal. There is no distension.      Palpations: Abdomen is soft.     Tenderness: There is no abdominal tenderness.  Musculoskeletal:     Right lower leg: No edema.     Left lower leg: No edema.  Skin:    General: Skin is warm.     Capillary Refill: Capillary refill takes less than 2 seconds.  Neurological:     General: No focal deficit present.     Mental Status: She is alert and oriented to person, place, and time.     Gait: Gait abnormal.  Psychiatric:        Mood and Affect: Mood normal.     Labs reviewed: Recent Labs    10/06/23 0805 05/17/24 0743  NA 138 140  K 4.3 4.1  CL 101 103  CO2 28 30  GLUCOSE 72 82  BUN 25 18  CREATININE 1.18* 0.89  CALCIUM 9.9 9.8   Recent Labs    10/06/23 0805 05/17/24 0743  AST 24 24  ALT 11 14  BILITOT 0.6 0.7  PROT 6.8 6.6   Recent Labs    10/06/23 0805 05/17/24 0743  WBC 5.1 5.1  NEUTROABS 2,254 1,969  HGB 12.8 13.2  HCT 39.1 40.2  MCV 90.1 89.9  PLT 277 242   Lab Results  Component Value Date   TSH 2.74 05/17/2024   Lab Results  Component Value Date   HGBA1C 5.7 (H) 05/17/2024   Lab Results  Component Value Date   CHOL 168 05/17/2024   HDL 74 05/17/2024   LDLCALC 79 05/17/2024   TRIG 70 05/17/2024   CHOLHDL 2.3 05/17/2024    Significant Diagnostic Results in last 30 days:  No results found.  Assessment/Plan 1. Essential (primary) hypertension (Primary) - controlled with amlodipine  2. Sleep disturbance - stable with Sonata  and magnesium glycinate  3. Age-related osteoporosis without current pathological fracture - DEXA t score - 3.6 left forearm - cont caltrate  4. Prediabetes - improved A1c, now 5.7 - cont diet low in carbs and sugar  5. Breast pain, left - one episode 06/2024 - mammogram pending     Family/ staff Communication: plan discussed with patient  Labs/tests ordered:  none      [1] No Known Allergies  "

## 2024-08-21 ENCOUNTER — Encounter: Payer: Self-pay | Admitting: Orthopedic Surgery

## 2024-08-21 ENCOUNTER — Ambulatory Visit: Payer: Self-pay | Admitting: Orthopedic Surgery

## 2024-08-21 ENCOUNTER — Other Ambulatory Visit: Payer: Self-pay | Admitting: Orthopedic Surgery

## 2024-08-21 DIAGNOSIS — R928 Other abnormal and inconclusive findings on diagnostic imaging of breast: Secondary | ICD-10-CM

## 2024-08-27 ENCOUNTER — Other Ambulatory Visit

## 2024-08-27 ENCOUNTER — Inpatient Hospital Stay: Admission: RE | Admit: 2024-08-27

## 2025-01-09 ENCOUNTER — Encounter: Admitting: Internal Medicine
